# Patient Record
Sex: Male | Born: 1970
Health system: Southern US, Community
[De-identification: ages and names within clinical notes are randomized; demographics above are authoritative.]

## PROBLEM LIST (undated history)

## (undated) DIAGNOSIS — G4730 Sleep apnea, unspecified: Secondary | ICD-10-CM

## (undated) DIAGNOSIS — E7800 Pure hypercholesterolemia, unspecified: Secondary | ICD-10-CM

## (undated) DIAGNOSIS — E119 Type 2 diabetes mellitus without complications: Secondary | ICD-10-CM

## (undated) DIAGNOSIS — I10 Essential (primary) hypertension: Secondary | ICD-10-CM

## (undated) DIAGNOSIS — N200 Calculus of kidney: Secondary | ICD-10-CM

## (undated) DIAGNOSIS — E669 Obesity, unspecified: Secondary | ICD-10-CM

## (undated) HISTORY — PX: WISDOM TOOTH EXTRACTION: SHX21

## (undated) HISTORY — DX: Essential (primary) hypertension: I10

## (undated) HISTORY — DX: Type 2 diabetes mellitus without complications: E11.9

## (undated) HISTORY — DX: Obesity, unspecified: E66.9

## (undated) HISTORY — DX: Pure hypercholesterolemia, unspecified: E78.00

## (undated) HISTORY — DX: Sleep apnea, unspecified: G47.30

---

## 2012-08-26 ENCOUNTER — Ambulatory Visit (INDEPENDENT_AMBULATORY_CARE_PROVIDER_SITE_OTHER): Payer: Managed Care, Other (non HMO) | Admitting: General Surgery

## 2012-08-26 ENCOUNTER — Encounter (INDEPENDENT_AMBULATORY_CARE_PROVIDER_SITE_OTHER): Payer: Self-pay | Admitting: General Surgery

## 2012-08-26 VITALS — BP 150/90 | HR 94 | Temp 99.7°F | Resp 20 | Ht 72.0 in | Wt 250.0 lb

## 2012-08-26 DIAGNOSIS — K644 Residual hemorrhoidal skin tags: Secondary | ICD-10-CM

## 2012-08-26 DIAGNOSIS — K645 Perianal venous thrombosis: Secondary | ICD-10-CM

## 2012-08-26 MED ORDER — AMOXICILLIN-POT CLAVULANATE 875-125 MG PO TABS: 1.0000 | ORAL_TABLET | Freq: Two times a day (BID) | ORAL | Status: AC

## 2012-08-26 MED ORDER — OXYCODONE-ACETAMINOPHEN 5-325 MG PO TABS: 1.0000 | ORAL_TABLET | Freq: Four times a day (QID) | ORAL | Status: DC | PRN

## 2012-08-26 NOTE — Patient Instructions (Signed)
Remove packing strip in the morning Get prescriptions and take antibiotic Call for Temp >101, worsening pain, pain with urination Soak in warm water tub 3-4 times a day and after a bowel movement Try to minimize constipation at all costs.  GETTING TO GOOD BOWEL HEALTH. Irregular bowel habits such as constipation and diarrhea can lead to many problems over time.  Having one soft bowel movement a day is the most important way to prevent further problems.  The anorectal canal is designed to handle stretching and feces to safely manage our ability to get rid of solid waste (feces, poop, stool) out of our body.  BUT, hard constipated stools can act like ripping concrete bricks and diarrhea can be a burning fire to this very sensitive area of our body, causing inflamed hemorrhoids, anal fissures, increasing risk is perirectal abscesses, abdominal pain/bloating, an making irritable bowel worse.     The goal: ONE SOFT BOWEL MOVEMENT A DAY!  To have soft, regular bowel movements:    Drink at least 8 tall glasses of water a day.     Take plenty of fiber.  Fiber is the undigested part of plant food that passes into the colon, acting s "natures broom" to encourage bowel motility and movement.  Fiber can absorb and hold large amounts of water. This results in a larger, bulkier stool, which is soft and easier to pass. Work gradually over several weeks up to 6 servings a day of fiber (25g a day even more if needed) in the form of: o Vegetables -- Root (potatoes, carrots, turnips), leafy green (lettuce, salad greens, celery, spinach), or cooked high residue (cabbage, broccoli, etc) o Fruit -- Fresh (unpeeled skin & pulp), Dried (prunes, apricots, cherries, etc ),  or stewed ( applesauce)  o Whole grain breads, pasta, etc (whole wheat)  o Bran cereals    Bulking Agents -- This type of water-retaining fiber generally is easily obtained each day by one of the following:  o Psyllium bran -- The psyllium plant is  remarkable because its ground seeds can retain so much water. This product is available as Metamucil, Konsyl, Effersyllium, Per Diem Fiber, or the less expensive generic preparation in drug and health food stores. Although labeled a laxative, it really is not a laxative.  o Methylcellulose -- This is another fiber derived from wood which also retains water. It is available as Citrucel. o Polyethylene Glycol - and "artificial" fiber commonly called Miralax or Glycolax.  It is helpful for people with gassy or bloated feelings with regular fiber o Flax Seed - a less gassy fiber than psyllium   No reading or other relaxing activity while on the toilet. If bowel movements take longer than 5 minutes, you are too constipated   AVOID CONSTIPATION.  High fiber and water intake usually takes care of this.  Sometimes a laxative is needed to stimulate more frequent bowel movements, but    Laxatives are not a good long-term solution as it can wear the colon out. o Osmotics (Milk of Magnesia, Fleets phosphosoda, Magnesium citrate, MiraLax, GoLytely) are safer than  o Stimulants (Senokot, Castor Oil, Dulcolax, Ex Lax)    o Do not take laxatives for more than 7days in a row.    IF SEVERELY CONSTIPATED, try a Bowel Retraining Program: o Do not use laxatives.  o Eat a diet high in roughage, such as bran cereals and leafy vegetables.  o Drink six (6) ounces of prune or apricot juice each morning.  o  Eat two (2) large servings of stewed fruit each day.  o Take one (1) heaping tablespoon of a psyllium-based bulking agent twice a day. Use sugar-free sweetener when possible to avoid excessive calories.  o Eat a normal breakfast.  o Set aside 15 minutes after breakfast to sit on the toilet, but do not strain to have a bowel movement.  o If you do not have a bowel movement by the third day, use an enema and repeat the above steps.  o

## 2012-08-26 NOTE — Progress Notes (Signed)
Patient ID: Rodney Dominguez, male   DOB: 04/25/1970, 42 y.o.   MRN: [id number]  Chief Complaint  Patient presents with  . New Evaluation    eval anal abscess    HPI Rodney Dominguez is a 42 y.o. male.   HPI 42 yo WM referred by Dr Manus Gunning for perirectal abscess. Patient states he was in his usual state health until this past Sunday. He states that on Sunday he noticed a small hard lump around his anus. It was tender. The area became more and more painful. He applied Preparation H without any relief. He noticed that the area became longer. On Wednesday it started draining blood and pus. He saw his primary care physician today who is concerned about abscess and was referred here. He is a diabetic but is not taking medication. He states that he is borderline. He denies any fever. He denies any dysuria or trouble urinating. He normally averages about 5-6 bowel movements a week. He sits on the toilet for about 3-4 minutes at a time. He denies any pain with defecation. He drinks one 32 ounce of water a day he denies any perianal itching or burning. He denies any prior symptoms Past Medical History  Diagnosis Date  . Sleep apnea   . Hypertension   . Hyperlipidemia     Past Surgical History  Procedure Laterality Date  . Wisdom tooth extraction      Family History  Problem Relation Age of Onset  . Hypertension Mother   . Hyperlipidemia Mother   . Cancer Mother   . Diabetes Mother   . Hypertension Father   . Hyperlipidemia Father     Social History History  Substance Use Topics  . Smoking status: Never Smoker   . Smokeless tobacco: Not on file  . Alcohol Use: No    No Known Allergies  Current Outpatient Prescriptions  Medication Sig Dispense Refill  . diltiazem (CARDIZEM CD) 240 MG 24 hr capsule Take 240 mg by mouth daily.      . Multiple Vitamin (MULTIVITAMIN) capsule Take 1 capsule by mouth daily.      . simvastatin (ZOCOR) 20 MG tablet Take 20 mg by mouth every evening.      .  valsartan-hydrochlorothiazide (DIOVAN-HCT) 160-25 MG per tablet Take 1 tablet by mouth daily.      Marland Kitchen amoxicillin-clavulanate (AUGMENTIN) 875-125 MG per tablet Take 1 tablet by mouth 2 (two) times daily.  24 tablet  0  . oxyCODONE-acetaminophen (ROXICET) 5-325 MG per tablet Take 1 tablet by mouth every 6 (six) hours as needed for pain.  30 tablet  0   No current facility-administered medications for this visit.    Review of Systems Review of Systems  Constitutional: Negative for fever, chills, appetite change and unexpected weight change.  HENT: Positive for dental problem.   Cardiovascular: Negative for chest pain.  Gastrointestinal: Positive for rectal pain. Negative for nausea, vomiting, abdominal pain and blood in stool.  Endocrine:       Borderline DM - no meds  Genitourinary: Negative for dysuria, scrotal swelling, difficulty urinating and testicular pain.  Neurological: Negative for dizziness and headaches.  All other systems reviewed and are negative.    Blood pressure 150/90, pulse 94, temperature 99.7 F (37.6 C), resp. rate 20, height 6' (1.829 m), weight 250 lb (113.399 kg).  Physical Exam Physical Exam  Constitutional: He is oriented to person, place, and time. He appears well-developed and well-nourished. No distress.  obese  HENT:  Head: Normocephalic  and atraumatic.  Right Ear: External ear normal.  Left Ear: External ear normal.  Eyes: Conjunctivae are normal. No scleral icterus.  Neck: No tracheal deviation present.  Cardiovascular: Normal rate.   Pulmonary/Chest: Effort normal. No stridor. No respiratory distress.  Abdominal: Soft. He exhibits no distension. There is no tenderness. There is no rebound.  Genitourinary: Rectal exam shows anal tone normal.     Right anterior extending to ant midline - has a necrotic abscessed external hemorrhoid - draining pus with necrotic skin over area. Has some mild thrombosed L lateral ext hemorrhoid. DRE- no fluctuance.  Tolerated DRE well  Musculoskeletal: He exhibits no edema.  Neurological: He is alert and oriented to person, place, and time.  Skin: Skin is warm and dry. No rash noted. He is not diaphoretic. No erythema.  Psychiatric: He has a normal mood and affect. His behavior is normal. Judgment and thought content normal.    Data Reviewed Dr Randel Books note   Assessment    Abscessed right anterior external hemorrhoid     Plan    It appears he has a necrotic thrombosed external hemorrhoid. It is actively draining. Dr. Ezzard Standing also examine the patient. He has some devitalized necrotic tissue. We sharply excised the necrotic tissue. The cavity was gently probed with a Q-tip applicator. The depth was only about 1-1/2 cm. The length of the opening was probably about 4 cm. It did not appear to track any further. There was no area of fluctuance on digital rectal exam.  It appears adequately drained. We both agreed that it did not need to be washed off in the operating room.  I put him on 12 days of Augmentin. He was given a prescription for Percocet. I packed the cavity with iodoform packing today.I instructed him to remove the packing in the morning. I also told him to do 3-4 Sitz bathes a day. I instructed him to contact the office for fever greater then 101, worsening pain, difficulty urinating or any questions or concerns  followup with me in 2 weeks  Mary Sella. Andrey Campanile, MD, FACS General, Bariatric, & Minimally Invasive Surgery [facility], [place]        [facility] M 08/26/2012, 4:33 PM

## 2012-09-08 ENCOUNTER — Ambulatory Visit (INDEPENDENT_AMBULATORY_CARE_PROVIDER_SITE_OTHER): Payer: Managed Care, Other (non HMO) | Admitting: General Surgery

## 2012-09-08 ENCOUNTER — Encounter (INDEPENDENT_AMBULATORY_CARE_PROVIDER_SITE_OTHER): Payer: Self-pay | Admitting: General Surgery

## 2012-09-08 VITALS — BP 110/84 | HR 69 | Temp 98.6°F | Resp 18 | Ht 72.0 in | Wt 249.6 lb

## 2012-09-08 DIAGNOSIS — Z09 Encounter for follow-up examination after completed treatment for conditions other than malignant neoplasm: Secondary | ICD-10-CM

## 2012-09-08 NOTE — Progress Notes (Signed)
Subjective:     Patient ID: Rodney Dominguez, male   DOB: 09-28-1970, 42 y.o.   MRN: [id number]  HPI 42 year old Caucasian male comes in for followup after his incision, drainage and debridement of the necrotic external hemorrhoid in the office. He states that he is doing well. He denies having any fever, chills, dysuria. He reports having a daily bowel movement. He will occasionally have some blood on the toilet tissue after wiping. He reports minimal drainage  Review of Systems     Objective:   Physical Exam BP 110/84  Pulse 69  Temp(Src) 98.6 F (37 C) (Temporal)  Resp 18  Ht 6' (1.829 m)  Wt 249 lb 9.6 oz (113.218 kg)  BMI 33.84 kg/m2 Alert, no apparent distress Rectal-visual inspection reveals an open wound in essentially the anterior midline position. There is no overlying skin in this area. The open wound measures 2 cm by about 1 cm. Depth is about 3 or 4 mm. Digital rectal and anoscopy was deferred today. There is granulation tissue is a filling. There is no fluctuance or cellulitis    Assessment:     S/p I&D &debridement of necrotic anterior ext hemorrhoid     Plan:     Overall I think it is healing well. I explained that'll take another 4-6 weeks for the area to heal by secondary intention. He was encouraged to drink plenty of liquids and a high fiber diet. Followup 8 weeks  Mary Sella. Andrey Campanile, MD, FACS General, Bariatric, & Minimally Invasive Surgery [facility], [place]

## 2012-11-05 ENCOUNTER — Ambulatory Visit (INDEPENDENT_AMBULATORY_CARE_PROVIDER_SITE_OTHER): Payer: Managed Care, Other (non HMO) | Admitting: General Surgery

## 2012-11-05 ENCOUNTER — Encounter (INDEPENDENT_AMBULATORY_CARE_PROVIDER_SITE_OTHER): Payer: Self-pay | Admitting: General Surgery

## 2012-11-05 VITALS — BP 152/70 | HR 72 | Temp 99.6°F | Resp 16 | Ht 72.0 in | Wt 258.4 lb

## 2012-11-05 DIAGNOSIS — K644 Residual hemorrhoidal skin tags: Secondary | ICD-10-CM

## 2012-11-05 NOTE — Progress Notes (Signed)
Subjective:     Patient ID: Rodney Dominguez, male   DOB: 10-May-1970, 42 y.o.   MRN: [id number]  HPI 42 year old Caucasian male comes in for long-term followup after incision, drainage, and debridement of necrotic external hemorrhoid in the office back in early May. I last saw him in the office in may 28. He states that he is doing great. He denies any constipation or diarrhea. He reports a daily bowel movement. He denies any rectal drainage. He denies any melena or hematochezia. He denies any pain with defecation. He denies any perianal itching or burning. He reports drinking 6 glasses of water a day. He is sitting on the commode for less than 2-3 minutes at a time  PMHx, PSHx, SOCHx, FAMHx, ALL reviewed   Review of Systems As above    Objective:   Physical Exam BP 152/70  Pulse 72  Temp(Src) 99.6 F (37.6 C) (Oral)  Resp 16  Ht 6' (1.829 m)  Wt 258 lb 6.4 oz (117.209 kg)  BMI 35.04 kg/m2 Alert, no apparent distress Skin-no jaundice or rash Rectal-well-healed right anterior to anterior midline perianal skin. Small residual scar. No anal fissure. No perianal skin excoriation. Digital rectal exam reveals good tone no palpable masses    Assessment:     History of necrotic external hemorrhoid with abscess     Plan:     It appears the area is completely healed. No signs of fistula in ano. We discussed the importance high fiber diet and good bathroom practices. F/u prn  Mary Sella. Andrey Campanile, MD, FACS General, Bariatric, & Minimally Invasive Surgery [facility], [place]

## 2012-11-05 NOTE — Patient Instructions (Signed)
It has healed  GETTING TO GOOD BOWEL HEALTH. Irregular bowel habits such as constipation and diarrhea can lead to many problems over time.  Having one soft bowel movement a day is the most important way to prevent further problems.  The anorectal canal is designed to handle stretching and feces to safely manage our ability to get rid of solid waste (feces, poop, stool) out of our body.  BUT, hard constipated stools can act like ripping concrete bricks and diarrhea can be a burning fire to this very sensitive area of our body, causing inflamed hemorrhoids, anal fissures, increasing risk is perirectal abscesses, abdominal pain/bloating, an making irritable bowel worse.     The goal: ONE SOFT BOWEL MOVEMENT A DAY!  To have soft, regular bowel movements:    Drink at least 8 tall glasses of water a day.     Take plenty of fiber.  Fiber is the undigested part of plant food that passes into the colon, acting s "natures broom" to encourage bowel motility and movement.  Fiber can absorb and hold large amounts of water. This results in a larger, bulkier stool, which is soft and easier to pass. Work gradually over several weeks up to 6 servings a day of fiber (25g a day even more if needed) in the form of: o Vegetables -- Root (potatoes, carrots, turnips), leafy green (lettuce, salad greens, celery, spinach), or cooked high residue (cabbage, broccoli, etc) o Fruit -- Fresh (unpeeled skin & pulp), Dried (prunes, apricots, cherries, etc ),  or stewed ( applesauce)  o Whole grain breads, pasta, etc (whole wheat)  o Bran cereals    Bulking Agents -- This type of water-retaining fiber generally is easily obtained each day by one of the following:  o Psyllium bran -- The psyllium plant is remarkable because its ground seeds can retain so much water. This product is available as Metamucil, Konsyl, Effersyllium, Per Diem Fiber, or the less expensive generic preparation in drug and health food stores. Although labeled a  laxative, it really is not a laxative.  o Methylcellulose -- This is another fiber derived from wood which also retains water. It is available as Citrucel. o Polyethylene Glycol - and "artificial" fiber commonly called Miralax or Glycolax.  It is helpful for people with gassy or bloated feelings with regular fiber o Flax Seed - a less gassy fiber than psyllium   No reading or other relaxing activity while on the toilet. If bowel movements take longer than 5 minutes, you are too constipated   AVOID CONSTIPATION.  High fiber and water intake usually takes care of this.  Sometimes a laxative is needed to stimulate more frequent bowel movements, but    Laxatives are not a good long-term solution as it can wear the colon out. o Osmotics (Milk of Magnesia, Fleets phosphosoda, Magnesium citrate, MiraLax, GoLytely) are safer than  o Stimulants (Senokot, Castor Oil, Dulcolax, Ex Lax)    o Do not take laxatives for more than 7days in a row.    IF SEVERELY CONSTIPATED, try a Bowel Retraining Program: o Do not use laxatives.  o Eat a diet high in roughage, such as bran cereals and leafy vegetables.  o Drink six (6) ounces of prune or apricot juice each morning.  o Eat two (2) large servings of stewed fruit each day.  o Take one (1) heaping tablespoon of a psyllium-based bulking agent twice a day. Use sugar-free sweetener when possible to avoid excessive calories.  o Eat a  normal breakfast.  o Set aside 15 minutes after breakfast to sit on the toilet, but do not strain to have a bowel movement.  o If you do not have a bowel movement by the third day, use an enema and repeat the above steps.  o

## 2013-06-20 ENCOUNTER — Encounter: Payer: Self-pay | Admitting: General Surgery

## 2013-06-20 DIAGNOSIS — E669 Obesity, unspecified: Secondary | ICD-10-CM | POA: Insufficient documentation

## 2013-06-20 DIAGNOSIS — I10 Essential (primary) hypertension: Secondary | ICD-10-CM

## 2013-06-20 DIAGNOSIS — G4733 Obstructive sleep apnea (adult) (pediatric): Secondary | ICD-10-CM

## 2013-06-28 ENCOUNTER — Ambulatory Visit (INDEPENDENT_AMBULATORY_CARE_PROVIDER_SITE_OTHER): Payer: BC Managed Care – PPO | Admitting: Cardiology

## 2013-06-28 ENCOUNTER — Encounter: Payer: Self-pay | Admitting: Cardiology

## 2013-06-28 VITALS — BP 144/90 | HR 98 | Ht 72.0 in | Wt 247.0 lb

## 2013-06-28 DIAGNOSIS — E669 Obesity, unspecified: Secondary | ICD-10-CM

## 2013-06-28 DIAGNOSIS — I10 Essential (primary) hypertension: Secondary | ICD-10-CM

## 2013-06-28 DIAGNOSIS — G4733 Obstructive sleep apnea (adult) (pediatric): Secondary | ICD-10-CM

## 2013-06-28 NOTE — Progress Notes (Signed)
[address] [place], [place]  [address] Phone: [phone] Fax:  [phone]  Date:  06/28/2013   ID:  Rodney Dominguez, DOB 1971/03/14, MRN [id number]  PCP:  Thora Lance, MD  Sleep Medicine:  Armanda Magic, MD   History of Present Illness: Rodney Dominguez is a 43 y.o. male with a history of OSA, HTN and obesity who presents today for followup.  He is doing well.  He tolerates his CPAP therapy with no problems.  He feels rested in the am and has no daytime sleepiness.  He tolerates the mask and feels the pressure is adequate.   Wt Readings from Last 3 Encounters:  06/28/13 247 lb (112.038 kg)  11/05/12 258 lb 6.4 oz (117.209 kg)  09/08/12 249 lb 9.6 oz (113.218 kg)     Past Medical History  Diagnosis Date  . Sleep apnea   . Hypertension   . Hypercholesteremia   . Diabetes mellitus without complication     type II  . Obesity     Current Outpatient Prescriptions  Medication Sig Dispense Refill  . atorvastatin (LIPITOR) 20 MG tablet Once daily      . BG STAR TEST test strip       . diltiazem (CARDIZEM CD) 240 MG 24 hr capsule Take 240 mg by mouth daily.      Marland Kitchen EPIPEN 2-PAK 0.3 MG/0.3ML SOAJ       . Multiple Vitamins-Minerals (MULTIVITAMIN WITH MINERALS) tablet Take 1 tablet by mouth daily.      . valsartan-hydrochlorothiazide (DIOVAN-HCT) 320-25 MG per tablet Take 1 tablet by mouth daily.       No current facility-administered medications for this visit.    Allergies:    Allergies  Allergen Reactions  . Bee Venom Anaphylaxis  . Ace Inhibitors Cough    Social History:  The patient  reports that he has quit smoking. He does not have any smokeless tobacco history on file. He reports that he does not drink alcohol or use illicit drugs.   Family History:  The patient's family history includes Cancer in his mother; Diabetes in his mother; Hyperlipidemia in his father and mother; Hypertension in his father and mother.   ROS:  Please see the history of  present illness.      All other systems reviewed and negative.   PHYSICAL EXAM: VS:  BP 144/90  Pulse 98  Ht 6' (1.829 m)  Wt 247 lb (112.038 kg)  BMI 33.49 kg/m2 Well nourished, well developed, in no acute distress HEENT: normal Neck: no JVD Cardiac:  normal S1, S2; RRR; no murmur Lungs:  clear to auscultation bilaterally, no wheezing, rhonchi or rales Abd: soft, nontender, no hepatomegaly Ext: no edema Skin: warm and dry Neuro:  CNs 2-12 intact, no focal abnormalities noted       ASSESSMENT AND PLAN:  1. OSA on CPAP.  His download today showed an AHI of 0.6/hr on 11cm H2O and 59% compliance in using more than 4 hours nightly.  I stressed the importance of being complaint with his CPAP.  He went on a trip for a few days recently and then when he got back he did not hook it back up for 3 months.  He also does not always get more than 5 hours of sleep.   2. Obesity- I have encouraged him to get into a routine exercise program 3. HTN - borderline control  - I asked him to check his BP daily for a week and  call with results.  - continue Diltiazem and Diovan HCT  - he will check his BP daily for a week and call with the results  Followup with me in 6 months  Signed, Armanda Magicraci Turner, MD 06/28/2013 9:12 AM

## 2013-06-28 NOTE — Patient Instructions (Signed)
Your physician recommends that you continue on your current medications as directed. Please refer to the Current Medication list given to you today.  Your physician wants you to follow-up in: 6 months with Dr Turner You will receive a reminder letter in the mail two months in advance. If you don't receive a letter, please call our office to schedule the follow-up appointment.  

## 2013-07-08 ENCOUNTER — Telehealth: Payer: Self-pay | Admitting: Cardiology

## 2013-07-08 NOTE — Telephone Encounter (Signed)
I spoke with pt & states these blood pressures were taken after his medications ( diltiazem & diovan) States he did miss one day of taking meds this week.  States that he is feeling great Will forward this to Dr. Mayford Knifeurner for review.  Mylo Redebbie Windy Dudek RN

## 2013-07-08 NOTE — Telephone Encounter (Signed)
New message    patient calling back with blood pressure reading    3/17 144/90  3/18 138/85 3/19 140/87 3/20 142/89 3/21 142/93 3/23 135/91 3/27 132/89 .  Skip 3/22, 3/24-3/26

## 2013-07-11 NOTE — Telephone Encounter (Signed)
Please increase Cardizem CD to 300 mg daily and check BP daily for a week and call with results

## 2013-07-11 NOTE — Telephone Encounter (Signed)
I called pt & he would like to have his pcp Dr. Manus GunningEhinger review readings as well b/c " he was the one who is treating my high blood pressure & cholesterol"  I have forwarded note to Dr. Kyla BalzarineEhinger Debbie Atley Scarboro RN

## 2013-07-11 NOTE — Telephone Encounter (Signed)
Are you ok calling this pt?

## 2013-07-11 NOTE — Telephone Encounter (Signed)
That is fine 

## 2013-07-13 ENCOUNTER — Encounter: Payer: Self-pay | Admitting: Cardiology

## 2013-10-04 ENCOUNTER — Emergency Department (HOSPITAL_COMMUNITY): Payer: BC Managed Care – PPO

## 2013-10-04 ENCOUNTER — Emergency Department (HOSPITAL_COMMUNITY)
Admission: EM | Admit: 2013-10-04 | Discharge: 2013-10-04 | Disposition: A | Discharge status: Home / Self Care | Payer: BC Managed Care – PPO | Attending: Emergency Medicine | Admitting: Emergency Medicine

## 2013-10-04 ENCOUNTER — Encounter (HOSPITAL_COMMUNITY): Payer: Self-pay | Admitting: Emergency Medicine

## 2013-10-04 DIAGNOSIS — R112 Nausea with vomiting, unspecified: Secondary | ICD-10-CM | POA: Insufficient documentation

## 2013-10-04 DIAGNOSIS — I10 Essential (primary) hypertension: Secondary | ICD-10-CM | POA: Insufficient documentation

## 2013-10-04 DIAGNOSIS — E7800 Pure hypercholesterolemia, unspecified: Secondary | ICD-10-CM | POA: Insufficient documentation

## 2013-10-04 DIAGNOSIS — Z79899 Other long term (current) drug therapy: Secondary | ICD-10-CM | POA: Insufficient documentation

## 2013-10-04 DIAGNOSIS — R630 Anorexia: Secondary | ICD-10-CM | POA: Insufficient documentation

## 2013-10-04 DIAGNOSIS — G4730 Sleep apnea, unspecified: Secondary | ICD-10-CM | POA: Insufficient documentation

## 2013-10-04 DIAGNOSIS — Z888 Allergy status to other drugs, medicaments and biological substances status: Secondary | ICD-10-CM | POA: Insufficient documentation

## 2013-10-04 DIAGNOSIS — Z7982 Long term (current) use of aspirin: Secondary | ICD-10-CM | POA: Insufficient documentation

## 2013-10-04 DIAGNOSIS — E119 Type 2 diabetes mellitus without complications: Secondary | ICD-10-CM | POA: Insufficient documentation

## 2013-10-04 DIAGNOSIS — E669 Obesity, unspecified: Secondary | ICD-10-CM | POA: Insufficient documentation

## 2013-10-04 DIAGNOSIS — N200 Calculus of kidney: Secondary | ICD-10-CM | POA: Insufficient documentation

## 2013-10-04 DIAGNOSIS — Z91038 Other insect allergy status: Secondary | ICD-10-CM | POA: Insufficient documentation

## 2013-10-04 LAB — BASIC METABOLIC PANEL
BUN: 14 mg/dL (ref 6–23)
CHLORIDE: 101 meq/L (ref 96–112)
CO2: 25 meq/L (ref 19–32)
Calcium: 8.7 mg/dL (ref 8.4–10.5)
Creatinine, Ser: 1.1 mg/dL (ref 0.50–1.35)
GFR calc Af Amer: >90 mL/min (ref >90)
GFR calc non Af Amer: 81 mL/min — ABNORMAL LOW (ref >90)
GLUCOSE: 109 mg/dL — AB (ref 70–99)
POTASSIUM: 2.9 meq/L — AB (ref 3.7–5.3)
Sodium: 141 mEq/L (ref 137–147)

## 2013-10-04 LAB — CBC WITH DIFFERENTIAL/PLATELET
Basophils Absolute: 0 10*3/uL (ref 0.0–0.1)
Basophils Relative: 0 % (ref 0–1)
Eosinophils Absolute: 0 10*3/uL (ref 0.0–0.7)
Eosinophils Relative: 0 % (ref 0–5)
HCT: 35.1 % — ABNORMAL LOW (ref 39.0–52.0)
HEMOGLOBIN: 12.3 g/dL — AB (ref 13.0–17.0)
LYMPHS ABS: 1.3 10*3/uL (ref 0.7–4.0)
Lymphocytes Relative: 18 % (ref 12–46)
MCH: 30.1 pg (ref 26.0–34.0)
MCHC: 35 g/dL (ref 30.0–36.0)
MCV: 85.8 fL (ref 78.0–100.0)
MONOS PCT: 11 % (ref 3–12)
Monocytes Absolute: 0.8 10*3/uL (ref 0.1–1.0)
NEUTROS ABS: 5.1 10*3/uL (ref 1.7–7.7)
Neutrophils Relative %: 71 % (ref 43–77)
Platelets: 247 10*3/uL (ref 150–400)
RBC: 4.09 MIL/uL — AB (ref 4.22–5.81)
RDW: 12.8 % (ref 11.5–15.5)
WBC: 7.3 10*3/uL (ref 4.0–10.5)

## 2013-10-04 LAB — URINALYSIS, ROUTINE W REFLEX MICROSCOPIC
Bilirubin Urine: NEGATIVE
Glucose, UA: NEGATIVE mg/dL
Ketones, ur: NEGATIVE mg/dL
Nitrite: NEGATIVE
PROTEIN: NEGATIVE mg/dL
Specific Gravity, Urine: 1.013 (ref 1.005–1.030)
Urobilinogen, UA: 1 mg/dL (ref 0.0–1.0)
pH: 6.5 (ref 5.0–8.0)

## 2013-10-04 LAB — URINE MICROSCOPIC-ADD ON

## 2013-10-04 MED ORDER — KETOROLAC TROMETHAMINE 30 MG/ML IJ SOLN
30.0000 mg | Freq: Once | INTRAMUSCULAR | Status: AC
  Administered 2013-10-04: 30 mg via INTRAVENOUS
  Filled 2013-10-04: qty 1

## 2013-10-04 MED ORDER — PROMETHAZINE HCL 25 MG PO TABS: 25.0000 mg | ORAL_TABLET | Freq: Four times a day (QID) | ORAL | Status: DC | PRN

## 2013-10-04 MED ORDER — SODIUM CHLORIDE 0.9 % IV BOLUS (SEPSIS)
1000.0000 mL | Freq: Once | INTRAVENOUS | Status: AC
  Administered 2013-10-04: 1000 mL via INTRAVENOUS

## 2013-10-04 MED ORDER — ONDANSETRON 4 MG PO TBDP
8.0000 mg | ORAL_TABLET | Freq: Once | ORAL | Status: AC
  Administered 2013-10-04: 8 mg via ORAL
  Filled 2013-10-04: qty 2

## 2013-10-04 MED ORDER — HYDROMORPHONE HCL PF 1 MG/ML IJ SOLN
1.0000 mg | Freq: Once | INTRAMUSCULAR | Status: AC
Start: 2013-10-04 — End: 2013-10-04
  Administered 2013-10-04: 1 mg via INTRAVENOUS
  Filled 2013-10-04: qty 1

## 2013-10-04 MED ORDER — OXYCODONE-ACETAMINOPHEN 5-325 MG PO TABS: 2.0000 | ORAL_TABLET | ORAL | Status: DC | PRN

## 2013-10-04 MED ORDER — POTASSIUM CHLORIDE CRYS ER 20 MEQ PO TBCR
80.0000 meq | EXTENDED_RELEASE_TABLET | Freq: Once | ORAL | Status: AC
  Administered 2013-10-04: 80 meq via ORAL
  Filled 2013-10-04: qty 4

## 2013-10-04 MED ORDER — ONDANSETRON 4 MG PO TBDP: ORAL_TABLET | ORAL | Status: DC

## 2013-10-04 MED ORDER — HYDROMORPHONE HCL PF 1 MG/ML IJ SOLN
1.0000 mg | Freq: Once | INTRAMUSCULAR | Status: AC
  Administered 2013-10-04: 1 mg via INTRAVENOUS
  Filled 2013-10-04: qty 1

## 2013-10-04 MED ORDER — MORPHINE SULFATE 4 MG/ML IJ SOLN
4.0000 mg | Freq: Once | INTRAMUSCULAR | Status: AC
  Administered 2013-10-04: 4 mg via INTRAVENOUS
  Filled 2013-10-04: qty 1

## 2013-10-04 NOTE — Discharge Instructions (Signed)
Your CT shows that you have a large kidney stone. The urologist on call today was Dr. Mena Goes, and he wants you to call the office as soon as you leave the emergency department, so he can make an appointment with you for later today. Use the Zofran OR Phenergan (which ever is cheaper for you) for nausea. Use Percocet as directed for pain. If your pain increases and is unrelieved by this medication, call the urology office again. Stay well hydrated. Do not operate heavy machinery or drive while taking pain medicine.  Kidney Stones Kidney stones (urolithiasis) are deposits that form inside your kidneys. The intense pain is caused by the stone moving through the urinary tract. When the stone moves, the ureter goes into spasm around the stone. The stone is usually passed in the urine.  CAUSES   A disorder that makes certain neck glands produce too much parathyroid hormone (primary hyperparathyroidism).  A buildup of uric acid crystals, similar to gout in your joints.  Narrowing (stricture) of the ureter.  A kidney obstruction present at birth (congenital obstruction).  Previous surgery on the kidney or ureters.  Numerous kidney infections. SYMPTOMS   Feeling sick to your stomach (nauseous).  Throwing up (vomiting).  Blood in the urine (hematuria).  Pain that usually spreads (radiates) to the groin.  Frequency or urgency of urination. DIAGNOSIS   Taking a history and physical exam.  Blood or urine tests.  CT scan.  Occasionally, an examination of the inside of the urinary bladder (cystoscopy) is performed. TREATMENT   Observation.  Increasing your fluid intake.  Extracorporeal shock wave lithotripsy--This is a noninvasive procedure that uses shock waves to break up kidney stones.  Surgery may be needed if you have severe pain or persistent obstruction. There are various surgical procedures. Most of the procedures are performed with the use of small instruments. Only small  incisions are needed to accommodate these instruments, so recovery time is minimized. The size, location, and chemical composition are all important variables that will determine the proper choice of action for you. Talk to your health care provider to better understand your situation so that you will minimize the risk of injury to yourself and your kidney.  HOME CARE INSTRUCTIONS   Drink enough water and fluids to keep your urine clear or pale yellow. This will help you to pass the stone or stone fragments.  Strain all urine through the provided strainer. Keep all particulate matter and stones for your health care provider to see. The stone causing the pain may be as small as a grain of salt. It is very important to use the strainer each and every time you pass your urine. The collection of your stone will allow your health care provider to analyze it and verify that a stone has actually passed. The stone analysis will often identify what you can do to reduce the incidence of recurrences.  Only take over-the-counter or prescription medicines for pain, discomfort, or fever as directed by your health care provider.  Make a follow-up appointment with your health care provider as directed.  Get follow-up X-rays if required. The absence of pain does not always mean that the stone has passed. It may have only stopped moving. If the urine remains completely obstructed, it can cause loss of kidney function or even complete destruction of the kidney. It is your responsibility to make sure X-rays and follow-ups are completed. Ultrasounds of the kidney can show blockages and the status of the kidney. Ultrasounds  are not associated with any radiation and can be performed easily in a matter of minutes. SEEK MEDICAL CARE IF:  You experience pain that is progressive and unresponsive to any pain medicine you have been prescribed. SEEK IMMEDIATE MEDICAL CARE IF:   Pain cannot be controlled with the prescribed  medicine.  You have a fever or shaking chills.  The severity or intensity of pain increases over 18 hours and is not relieved by pain medicine.  You develop a new onset of abdominal pain.  You feel faint or pass out.  You are unable to urinate. MAKE SURE YOU:   Understand these instructions.  Will watch your condition.  Will get help right away if you are not doing well or get worse. Document Released: 03/31/2005 Document Revised: 12/01/2012 Document Reviewed: 09/01/2012 [facility] Patient Information 2015 [place], [place]. This information is not intended to replace advice given to you by your health care provider. Make sure you discuss any questions you have with your health care provider.  Dietary Guidelines to Help Prevent Kidney Stones Your risk of kidney stones can be decreased by adjusting the foods you eat. The most important thing you can do is drink enough fluid. You should drink enough fluid to keep your urine clear or pale yellow. The following guidelines provide specific information for the type of kidney stone you have had. GUIDELINES ACCORDING TO TYPE OF KIDNEY STONE Calcium Oxalate Kidney Stones  Reduce the amount of salt you eat. Foods that have a lot of salt cause your body to release excess calcium into your urine. The excess calcium can combine with a substance called oxalate to form kidney stones.  Reduce the amount of animal protein you eat if the amount you eat is excessive. Animal protein causes your body to release excess calcium into your urine. Ask your dietitian how much protein from animal sources you should be eating.  Avoid foods that are high in oxalates. If you take vitamins, they should have less than 500 mg of vitamin C. Your body turns vitamin C into oxalates. You do not need to avoid fruits and vegetables high in vitamin C. Calcium Phosphate Kidney Stones  Reduce the amount of salt you eat to help prevent the release of excess calcium into your  urine.  Reduce the amount of animal protein you eat if the amount you eat is excessive. Animal protein causes your body to release excess calcium into your urine. Ask your dietitian how much protein from animal sources you should be eating.  Get enough calcium from food or take a calcium supplement (ask your dietitian for recommendations). Food sources of calcium that do not increase your risk of kidney stones include:  Broccoli.  Dairy products, such as cheese and yogurt.  Pudding. Uric Acid Kidney Stones  Do not have more than 6 oz of animal protein per day. FOOD SOURCES Animal Protein Sources  Meat (all types).  Poultry.  Eggs.  Fish, seafood. Foods High in MirantSalt  Salt seasonings.  Soy sauce.  Teriyaki sauce.  Cured and processed meats.  Salted crackers and snack foods.  Fast food.  Canned soups and most canned foods. Foods High in Oxalates  Grains:  Amaranth.  Barley.  Grits.  Wheat germ.  Bran.  Buckwheat flour.  All bran cereals.  Pretzels.  Whole wheat bread.  Vegetables:  Beans (wax).  Beets and beet greens.  Collard greens.  Eggplant.  Escarole.  Leeks.  Okra.  Parsley.  Rutabagas.  Spinach.  Swiss  chard.  Tomato paste.  Fried potatoes.  Sweet potatoes.  Fruits:  Red currants.  Figs.  Kiwi.  Rhubarb.  Meat and Other Protein Sources:  Beans (dried).  Soy burgers and other soybean products.  Miso.  Nuts (peanuts, almonds, pecans, cashews, hazelnuts).  Nut butters.  Sesame seeds and tahini (paste made of sesame seeds).  Poppy seeds.  Beverages:  Chocolate drink mixes.  Soy milk.  Instant iced tea.  Juices made from high-oxalate fruits or vegetables.  Other:  Carob.  Chocolate.  Fruitcake.  Marmalades. Document Released: 07/26/2010 Document Revised: 04/05/2013 Document Reviewed: 02/25/2013 [facility]ExitCare Patient Information 2015 [place], [place]. This information is not intended to  replace advice given to you by your health care provider. Make sure you discuss any questions you have with your health care provider.

## 2013-10-04 NOTE — ED Provider Notes (Signed)
CSN: [id number]     Arrival date & time 10/04/13  0115 History   First MD Initiated Contact with Patient 10/04/13 0600     Chief Complaint  Patient presents with  . Flank Pain     (Consider location/radiation/quality/duration/timing/severity/associated sxs/prior Treatment) HPI Comments: Rodney Dominguez is a 43 y.o. Male with a PMHx of HTN and HLD who presents to the ED today with complaints of intermittent 8/10 sharp L sided flank pain that radiates into his L abdomen and occasionally into his groin. Reports that the pain began approximately 2 weeks ago, but was not as intense and was very infrequent. Subsequently, beginning Thursday 09/29/13 the pain intensified and became more frequent, although still intermittent in nature. He also developed N/V at this time. Pain is aggravated by laying on affected side and movement, alleviated by laying on unaffected side. States at first his urine looked darker, but has cleared up slightly; no frank hematuria noted by pt. Endorses decreased appetite over the last few days, but he has been able to keep fluids down. Denies CP, SOB, HA, neck pain, sore throat, rhinorrhea, diarrhea/constipation, testicular swelling, penile discharge, myalgias or arthralgias. Non-smoker, denies alcohol, no illicit drugs. No known allergies to medications or contrast dyes.   Patient is a 43 y.o. male presenting with flank pain. The history is provided by the patient. No language interpreter was used.  Flank Pain This is a new problem. The current episode started in the past 7 days. The problem occurs intermittently. The problem has been unchanged. Associated symptoms include abdominal pain, anorexia, nausea, urinary symptoms and vomiting. Pertinent negatives include no arthralgias, change in bowel habit, chest pain, chills, congestion, coughing, fever, headaches, myalgias, neck pain, rash, sore throat or weakness. Exacerbated by: movement, laying on unaffected side. He has tried rest for  the symptoms. The treatment provided no relief.    Past Medical History  Diagnosis Date  . Sleep apnea   . Hypertension   . Hypercholesteremia   . Diabetes mellitus without complication     type II  . Obesity    Past Surgical History  Procedure Laterality Date  . Wisdom tooth extraction     Family History  Problem Relation Age of Onset  . Hypertension Mother   . Hyperlipidemia Mother   . Cancer Mother   . Diabetes Mother   . Hypertension Father   . Hyperlipidemia Father    History  Substance Use Topics  . Smoking status: Former Games developermoker  . Smokeless tobacco: Not on file     Comment: ocassionally smokes a cigar  . Alcohol Use: No    Review of Systems  Constitutional: Positive for appetite change. Negative for fever and chills.  HENT: Negative for congestion, rhinorrhea and sore throat.   Eyes: Negative for pain and visual disturbance.  Respiratory: Negative for cough and shortness of breath.   Cardiovascular: Negative for chest pain.  Gastrointestinal: Positive for nausea, vomiting, abdominal pain and anorexia. Negative for diarrhea, constipation, blood in stool, abdominal distention and change in bowel habit.  Genitourinary: Positive for flank pain, decreased urine volume and difficulty urinating. Negative for dysuria, urgency, frequency, discharge, penile swelling, scrotal swelling, penile pain and testicular pain.  Musculoskeletal: Positive for back pain. Negative for arthralgias, myalgias, neck pain and neck stiffness.  Skin: Negative for rash.  Neurological: Negative for dizziness, syncope, weakness and headaches.  Psychiatric/Behavioral: Negative for confusion.  10 Systems reviewed and are negative for acute change except as noted in the HPI.  Allergies  Bee venom and Ace inhibitors  Home Medications   Prior to Admission medications   Medication Sig Start Date End Date Taking? Authorizing Provider  aspirin EC 81 MG tablet Take 81 mg by mouth daily.   Yes  Historical Provider, MD  atorvastatin (LIPITOR) 20 MG tablet Take 20 mg by mouth daily. Once daily 05/25/13  Yes Historical Provider, MD  diltiazem (CARDIZEM CD) 240 MG 24 hr capsule Take 240 mg by mouth daily.   Yes Historical Provider, MD  EPIPEN 2-PAK 0.3 MG/0.3ML SOAJ 0.3 mg once.  08/02/12  Yes Historical Provider, MD  Multiple Vitamins-Minerals (MULTIVITAMIN WITH MINERALS) tablet Take 1 tablet by mouth daily.   Yes Historical Provider, MD  valsartan-hydrochlorothiazide (DIOVAN-HCT) 320-25 MG per tablet Take 1 tablet by mouth daily.   Yes Historical Provider, MD  BG STAR TEST test strip  05/28/13   Historical Provider, MD  ondansetron (ZOFRAN ODT) 4 MG disintegrating tablet 4mg  ODT q6 hours prn nausea/vomit 10/04/13   Mercedes Strupp Camprubi-Soms, PA-C  oxyCODONE-acetaminophen (PERCOCET) 5-325 MG per tablet Take 2 tablets by mouth every 4 (four) hours as needed for moderate pain. 10/04/13   Mercedes Strupp Camprubi-Soms, PA-C  promethazine (PHENERGAN) 25 MG tablet Take 1 tablet (25 mg total) by mouth every 6 (six) hours as needed for nausea or vomiting. 10/04/13   [place] Camprubi-Soms, PA-C   BP 123/84  Pulse 89  Temp(Src) 98.2 F (36.8 C) (Oral)  Resp 11  Wt 236 lb (107.049 kg)  SpO2 95% Physical Exam  Nursing note and vitals reviewed. Constitutional: He is oriented to person, place, and time. Vital signs are normal. He appears well-developed and well-nourished. No distress.  HENT:  Head: Normocephalic and atraumatic.  Nose: Nose normal.  Mouth/Throat: Oropharynx is clear and moist. Mucous membranes are dry.  Mucous membranes slightly dry  Eyes: Conjunctivae and EOM are normal. Right eye exhibits no discharge. Left eye exhibits no discharge.  Neck: Normal range of motion. Neck supple.  Cardiovascular: Normal rate, regular rhythm, normal heart sounds and intact distal pulses.   No murmur heard. Pulmonary/Chest: Effort normal and breath sounds normal. He has no wheezes. He has  no rales.  Abdominal: Soft. Normal appearance and bowel sounds are normal. He exhibits no distension. There is tenderness in the left upper quadrant and left lower quadrant. There is CVA tenderness. There is no rigidity, no rebound and no guarding.  Left sided abdominal TTP with no R/G/R. +BS throughout. +CVA tenderness on L side  Musculoskeletal: Normal range of motion.  Neurological: He is alert and oriented to person, place, and time.  Skin: Skin is warm, dry and intact. No rash noted.  Psychiatric: He has a normal mood and affect.    ED Course  Procedures (including critical care time) Labs Review Labs Reviewed  URINALYSIS, ROUTINE W REFLEX MICROSCOPIC - Abnormal; Notable for the following:    Hgb urine dipstick TRACE (*)    Leukocytes, UA TRACE (*)    All other components within normal limits  CBC WITH DIFFERENTIAL - Abnormal; Notable for the following:    RBC 4.09 (*)    Hemoglobin 12.3 (*)    HCT 35.1 (*)    All other components within normal limits  BASIC METABOLIC PANEL - Abnormal; Notable for the following:    Potassium 2.9 (*)    Glucose, Bld 109 (*)    GFR calc non Af Amer 81 (*)    All other components within normal limits  URINE MICROSCOPIC-ADD ON  Imaging Review Ct Abdomen Pelvis Wo Contrast  10/04/2013   CLINICAL DATA:  Left flank pain for several weeks. Occasional nausea and vomiting. No history of stones.  EXAM: CT ABDOMEN AND PELVIS WITHOUT CONTRAST  TECHNIQUE: Multidetector CT imaging of the abdomen and pelvis was performed following the standard protocol without IV contrast.  COMPARISON:  None.  FINDINGS: 2.4 cm left renal pelvis stone lying just above the ureteropelvic junction. There is moderate left hydronephrosis and left perinephric and peripelvic fat stranding. Left ureter is normal course and caliber with no stones. There is a 4-5 mm nonobstructing stone in the lower pole of the right kidney. No other intrarenal stones. No renal masses. No right  hydronephrosis. Normal right ureter.  Bladder wall appears mildly thickened, but this may be a function of the minimal distention. No bladder stone or mass.  Heart is top-normal in size.  Lung bases are essentially clear.  Diffuse fatty infiltration of the liver. No liver mass or focal lesion. Normal liver size.  Multiple gallstones. Gallbladder otherwise unremarkable with no inflammatory change.  Normal spleen, pancreas and adrenal glands.  No pathologically enlarged lymph nodes.  No ascites.  Bowel is unremarkable.  Normal appendix.  Degenerative changes noted of the spine. There is significant disc bulging, possibly a chronic disc protrusion, at L5-S1.  IMPRESSION: 1. 2.4 cm stone in the left renal pelvis just above the ureteral vesicular junction, which is causing obstruction leading to moderate left hydronephrosis and perinephric and peripelvic stranding. 2. No other acute finding. 3. Small nonobstructing stone in the lower pole of the right kidney. 4. Gallstones. Hepatic steatosis. Degenerative changes noted of the visualized spine particularly L5-S1 where there may be a chronic broad-based disc protrusion.   Electronically Signed   By: Amie Portlandavid  Ormond M.D.   On: 10/04/2013 07:53     EKG Interpretation None      MDM   Final diagnoses:  Left nephrolithiasis    Rodney Dominguez is a 43 y.o. male with a PMHx of HTN and HLD who presents with 2 weeks of intermittent L flank pain that worsened 4 days ago, associated with N/V. No symptoms of infection reported. Pt afebrile, nontoxic appearing, with +CVA tenderness and mild L sided/LUQ abd TTP. DDx includes nephrolithiasis, pyelo, or UTI. Will obtain U/A, abd/pelvis CT w/o contrast, and give morphine and zofran and 1L bolus. Will reassess once results return.  8:30 AM U/A showing trace Hgb and trace leuks with no other signs of UTI. CT showing 2.4cm stone at the L renal ureteropelvic junction and associated hydronephrosis and perinephric stranding. Will  obtain basic labs to eval renal function. Pt reports that pain is tolerable with Morphine but has returned now. Will give Dilaudid 1mg  now and reassess. Consider consulting urology due to impressive size of this stone.  9:39 AM Pt endorses improved pain control with Dilaudid 1mg , now states it's a 2/10, but is beginning to creep up again. At this time, I don't believe pt will be able to control pain with PO medications, therefore will consult Urology and get recommendations regarding admission vs outpt therapy. Will reassess.  9:56 AM Potassium 2.9. Will replete orally.  10:18 AM Dr. Mena GoesEskridge from urology spoke with Ebbie Ridgehris Lawyer, PA-C. Advised to give PO pain meds and discharge home, with instructions to call the office today to get an appt. I explained this to the pt, and have given explicit precautions to return to the ER including for any other new or worsening symptoms. The patient understands  and accepts the medical plan as it's been dictated and I have answered their questions. Discharge instructions concerning home care and prescriptions have been given. The patient is STABLE and is discharged to home in good condition.  BP 123/84  Pulse 89  Temp(Src) 98.2 F (36.8 C) (Oral)  Resp 11  Wt 236 lb (107.049 kg)  SpO2 95%   Mercedes Strupp Camprubi-Soms, PA-C 10/04/13 1049

## 2013-10-04 NOTE — ED Notes (Addendum)
Pt in stating over the last few weeks he has been having intermittent left lower flank pain and nausea, occasional vomiting, states symptoms would resolve and stay away for a few days, tonight symptoms worsened and pain is increased- pt states pain has improved at this time

## 2013-10-04 NOTE — ED Provider Notes (Signed)
Medical screening examination/treatment/procedure(s) were performed by non-physician practitioner and as supervising physician I was immediately available for consultation/collaboration.   EKG Interpretation None       Ethelda ChickMartha K Linker, MD 10/04/13 1103

## 2013-10-04 NOTE — ED Notes (Signed)
Pt c/o intermittent flank pain that started a few weeks ago. Pt has had some nausea/ vomiting and diarrhea. Pt states last episode of diarrhea was on Saturday, last episode of emesis was at midnight. Pt rates pain at this time a 9/10. Pt didn't take anything for his pain. Denies any activity that may have brought his sx on.

## 2013-10-07 ENCOUNTER — Other Ambulatory Visit: Payer: Self-pay | Admitting: Urology

## 2013-10-13 ENCOUNTER — Encounter (HOSPITAL_COMMUNITY): Payer: Self-pay | Admitting: Pharmacy Technician

## 2013-10-20 ENCOUNTER — Ambulatory Visit (HOSPITAL_COMMUNITY)
Admission: RE | Admit: 2013-10-20 | Discharge: 2013-10-20 | Disposition: A | Discharge status: Home / Self Care | Payer: BC Managed Care – PPO | Source: Ambulatory Visit | Attending: Anesthesiology | Admitting: Anesthesiology

## 2013-10-20 ENCOUNTER — Encounter (HOSPITAL_COMMUNITY)
Admission: RE | Admit: 2013-10-20 | Discharge: 2013-10-20 | Disposition: A | Discharge status: Home / Self Care | Payer: BC Managed Care – PPO | Source: Ambulatory Visit | Attending: Urology | Admitting: Urology

## 2013-10-20 ENCOUNTER — Encounter (HOSPITAL_COMMUNITY): Payer: Self-pay

## 2013-10-20 DIAGNOSIS — Z01810 Encounter for preprocedural cardiovascular examination: Secondary | ICD-10-CM | POA: Insufficient documentation

## 2013-10-20 DIAGNOSIS — I10 Essential (primary) hypertension: Secondary | ICD-10-CM | POA: Insufficient documentation

## 2013-10-20 DIAGNOSIS — Z01812 Encounter for preprocedural laboratory examination: Secondary | ICD-10-CM | POA: Insufficient documentation

## 2013-10-20 DIAGNOSIS — N200 Calculus of kidney: Secondary | ICD-10-CM | POA: Insufficient documentation

## 2013-10-20 DIAGNOSIS — Z01818 Encounter for other preprocedural examination: Secondary | ICD-10-CM | POA: Insufficient documentation

## 2013-10-20 HISTORY — DX: Calculus of kidney: N20.0

## 2013-10-20 LAB — CBC
HEMATOCRIT: 38.3 % — AB (ref 39.0–52.0)
HEMOGLOBIN: 13.4 g/dL (ref 13.0–17.0)
MCH: 30.3 pg (ref 26.0–34.0)
MCHC: 35 g/dL (ref 30.0–36.0)
MCV: 86.7 fL (ref 78.0–100.0)
Platelets: 277 10*3/uL (ref 150–400)
RBC: 4.42 MIL/uL (ref 4.22–5.81)
RDW: 13.1 % (ref 11.5–15.5)
WBC: 7.1 10*3/uL (ref 4.0–10.5)

## 2013-10-20 LAB — BASIC METABOLIC PANEL
Anion gap: 15 (ref 5–15)
BUN: 18 mg/dL (ref 6–23)
CALCIUM: 9.3 mg/dL (ref 8.4–10.5)
CO2: 26 mEq/L (ref 19–32)
Chloride: 98 mEq/L (ref 96–112)
Creatinine, Ser: 1.24 mg/dL (ref 0.50–1.35)
GFR calc Af Amer: 81 mL/min — ABNORMAL LOW (ref >90)
GFR, EST NON AFRICAN AMERICAN: 70 mL/min — AB (ref >90)
Glucose, Bld: 118 mg/dL — ABNORMAL HIGH (ref 70–99)
POTASSIUM: 3.2 meq/L — AB (ref 3.7–5.3)
Sodium: 139 mEq/L (ref 137–147)

## 2013-10-20 NOTE — Pre-Procedure Instructions (Signed)
10-20-13 EKG/ CXR done today.

## 2013-10-20 NOTE — Patient Instructions (Addendum)
20 Kolbie Lepkowski  10/20/2013   Your procedure is scheduled on:  7-17 -2015  Enter through [facility] Entrance and follow signs to [facility]. Arrive at   0945     AM.  Call this number if you have problems the morning of surgery: [phone]  Or Presurgical Testing [phone](Thaily Hackworth) For Living Will and/or Health Care Power Attorney Forms: please provide copy for your medical record,may bring AM of surgery(Forms should be already notarized -we do not provide this service).(10-20-13  No information preferred today).   For Cpap use: Bring mask and tubing only.   Do not eat food:After Midnight.  May have clear liquids:up to 6 Hours before arrival. Nothing after :  Clear liquids include soda, tea, black coffee, apple or grape juice, broth.  Take these medicines the morning of surgery with A SIP OF WATER: Atorvastatin. Diltiazem.   Do not wear jewelry, make-up or nail polish.  Do not wear lotions, powders, or perfumes. You may wear deodorant.  Do not shave 48 hours(2 days) prior to first CHG shower(legs and under arms).(Shaving face and neck okay.)  Do not bring valuables to the hospital.(Hospital is not responsible for lost valuables).  Contacts, dentures or removable bridgework, body piercing, hair pins may not be worn into surgery.  Leave suitcase in the car. After surgery it may be brought to your room.  For patients admitted to the hospital, checkout time is 11:00 AM the day of discharge.(Restricted visitors-Any Persons displaying flu-like symptoms or illness).    Patients discharged the day of surgery will not be allowed to drive home. Must have responsible person with you x 24 hours once discharged.  Name and phone number of your driver: Anson Fret [phone] cell  Special Instructions: CHG(Chlorhedine 4%-"Hibiclens","Betasept","Aplicare") Shower Use Special Wash: see special instructions.(avoid face and  genitals)       ____________________________    [facility] - Preparing for Surgery Before surgery, you can play an important role.  Because skin is not sterile, your skin needs to be as free of germs as possible.  You can reduce the number of germs on your skin by washing with CHG (chlorahexidine gluconate) soap before surgery.  CHG is an antiseptic cleaner which kills germs and bonds with the skin to continue killing germs even after washing. Please DO NOT use if you have an allergy to CHG or antibacterial soaps.  If your skin becomes reddened/irritated stop using the CHG and inform your nurse when you arrive at Short Stay. Do not shave (including legs and underarms) for at least 48 hours prior to the first CHG shower.  You may shave your face/neck. Please follow these instructions carefully:  1.  Shower with CHG Soap the night before surgery and the  morning of Surgery.  2.  If you choose to wash your hair, wash your hair first as usual with your  normal  shampoo.  3.  After you shampoo, rinse your hair and body thoroughly to remove the  shampoo.                           4.  Use CHG as you would any other liquid soap.  You can apply chg directly  to the skin and wash                       Gently with a scrungie or clean washcloth.  5.  Apply the CHG  Soap to your body ONLY FROM THE NECK DOWN.   Do not use on face/ open                           Wound or open sores. Avoid contact with eyes, ears mouth and genitals (private parts).                       Wash face,  Genitals (private parts) with your normal soap.             6.  Wash thoroughly, paying special attention to the area where your surgery  will be performed.  7.  Thoroughly rinse your body with warm water from the neck down.  8.  DO NOT shower/wash with your normal soap after using and rinsing off  the CHG Soap.                9.  Pat yourself dry with a clean towel.            10.  Wear clean pajamas.            11.  Place clean  sheets on your bed the night of your first shower and do not  sleep with pets. Day of Surgery : Do not apply any lotions/deodorants the morning of surgery.  Please wear clean clothes to the hospital/surgery center.  FAILURE TO FOLLOW THESE INSTRUCTIONS MAY RESULT IN THE CANCELLATION OF YOUR SURGERY PATIENT SIGNATURE_________________________________  NURSE SIGNATURE__________________________________  ________________________________________________________________________

## 2013-10-28 ENCOUNTER — Observation Stay (HOSPITAL_COMMUNITY)
Admission: RE | Admit: 2013-10-28 | Discharge: 2013-10-29 | Disposition: A | Discharge status: Home / Self Care | Payer: BC Managed Care – PPO | Source: Ambulatory Visit | Attending: Urology | Admitting: Urology

## 2013-10-28 ENCOUNTER — Ambulatory Visit (HOSPITAL_COMMUNITY): Payer: BC Managed Care – PPO | Admitting: *Deleted

## 2013-10-28 ENCOUNTER — Ambulatory Visit (HOSPITAL_COMMUNITY): Payer: BC Managed Care – PPO

## 2013-10-28 ENCOUNTER — Encounter (HOSPITAL_COMMUNITY): Payer: BC Managed Care – PPO | Admitting: *Deleted

## 2013-10-28 ENCOUNTER — Encounter (HOSPITAL_COMMUNITY)
Admission: RE | Disposition: A | Discharge status: Home / Self Care | Payer: Self-pay | Source: Ambulatory Visit | Attending: Urology

## 2013-10-28 ENCOUNTER — Encounter (HOSPITAL_COMMUNITY): Payer: Self-pay | Admitting: *Deleted

## 2013-10-28 DIAGNOSIS — Z87891 Personal history of nicotine dependence: Secondary | ICD-10-CM | POA: Insufficient documentation

## 2013-10-28 DIAGNOSIS — N1330 Unspecified hydronephrosis: Secondary | ICD-10-CM | POA: Insufficient documentation

## 2013-10-28 DIAGNOSIS — N200 Calculus of kidney: Principal | ICD-10-CM | POA: Insufficient documentation

## 2013-10-28 DIAGNOSIS — E7800 Pure hypercholesterolemia, unspecified: Secondary | ICD-10-CM | POA: Insufficient documentation

## 2013-10-28 DIAGNOSIS — E785 Hyperlipidemia, unspecified: Secondary | ICD-10-CM | POA: Insufficient documentation

## 2013-10-28 DIAGNOSIS — I10 Essential (primary) hypertension: Secondary | ICD-10-CM | POA: Insufficient documentation

## 2013-10-28 DIAGNOSIS — G4730 Sleep apnea, unspecified: Secondary | ICD-10-CM | POA: Insufficient documentation

## 2013-10-28 DIAGNOSIS — E119 Type 2 diabetes mellitus without complications: Secondary | ICD-10-CM | POA: Insufficient documentation

## 2013-10-28 DIAGNOSIS — E669 Obesity, unspecified: Secondary | ICD-10-CM | POA: Insufficient documentation

## 2013-10-28 HISTORY — PX: CYSTOSCOPY W/ RETROGRADES: SHX1426

## 2013-10-28 HISTORY — PX: NEPHROLITHOTOMY: SHX5134

## 2013-10-28 LAB — GLUCOSE, CAPILLARY
GLUCOSE-CAPILLARY: 102 mg/dL — AB (ref 70–99)
Glucose-Capillary: 99 mg/dL (ref 70–99)

## 2013-10-28 LAB — HEMOGLOBIN AND HEMATOCRIT, BLOOD
HCT: 33.7 % — ABNORMAL LOW (ref 39.0–52.0)
HEMOGLOBIN: 11.6 g/dL — AB (ref 13.0–17.0)

## 2013-10-28 SURGERY — NEPHROLITHOTOMY PERCUTANEOUS
Anesthesia: General | Laterality: Left

## 2013-10-28 MED ORDER — MIDAZOLAM HCL 5 MG/5ML IJ SOLN
INTRAMUSCULAR | Status: DC | PRN
  Administered 2013-10-28: 2 mg via INTRAVENOUS

## 2013-10-28 MED ORDER — ONDANSETRON HCL 4 MG/2ML IJ SOLN
INTRAMUSCULAR | Status: DC | PRN
  Administered 2013-10-28: 4 mg via INTRAVENOUS

## 2013-10-28 MED ORDER — LIDOCAINE HCL (CARDIAC) 20 MG/ML IV SOLN
INTRAVENOUS | Status: DC | PRN
  Administered 2013-10-28: 50 mg via INTRAVENOUS

## 2013-10-28 MED ORDER — EPHEDRINE SULFATE 50 MG/ML IJ SOLN
INTRAMUSCULAR | Status: DC | PRN
  Administered 2013-10-28 (×4): 10 mg via INTRAVENOUS

## 2013-10-28 MED ORDER — OXYCODONE HCL 5 MG PO TABS: 5.0000 mg | ORAL_TABLET | ORAL | Status: DC | PRN

## 2013-10-28 MED ORDER — SODIUM CHLORIDE 0.9 % IJ SOLN
INTRAMUSCULAR | Status: AC
  Filled 2013-10-28: qty 10

## 2013-10-28 MED ORDER — SODIUM CHLORIDE 0.9 % IR SOLN
Status: DC | PRN
  Administered 2013-10-28: 15000 mL

## 2013-10-28 MED ORDER — SUFENTANIL CITRATE 50 MCG/ML IV SOLN
INTRAVENOUS | Status: AC
  Filled 2013-10-28: qty 1

## 2013-10-28 MED ORDER — SUFENTANIL CITRATE 50 MCG/ML IV SOLN
INTRAVENOUS | Status: DC | PRN
  Administered 2013-10-28: 10 ug via INTRAVENOUS
  Administered 2013-10-28: 5 ug via INTRAVENOUS
  Administered 2013-10-28: 10 ug via INTRAVENOUS

## 2013-10-28 MED ORDER — DILTIAZEM HCL ER COATED BEADS 240 MG PO CP24
240.0000 mg | ORAL_CAPSULE | Freq: Every morning | ORAL | Status: DC
Start: 2013-10-29 — End: 2013-10-29
  Administered 2013-10-29: 240 mg via ORAL
  Filled 2013-10-28: qty 1

## 2013-10-28 MED ORDER — ACETAMINOPHEN 500 MG PO TABS
1000.0000 mg | ORAL_TABLET | Freq: Four times a day (QID) | ORAL | Status: DC
  Administered 2013-10-28 – 2013-10-29 (×3): 1000 mg via ORAL
  Filled 2013-10-28 (×4): qty 2

## 2013-10-28 MED ORDER — NEOSTIGMINE METHYLSULFATE 10 MG/10ML IV SOLN
INTRAVENOUS | Status: AC
  Filled 2013-10-28: qty 1

## 2013-10-28 MED ORDER — HYDROMORPHONE HCL PF 1 MG/ML IJ SOLN
0.2500 mg | INTRAMUSCULAR | Status: DC | PRN
  Administered 2013-10-28 (×2): 0.5 mg via INTRAVENOUS

## 2013-10-28 MED ORDER — NEOSTIGMINE METHYLSULFATE 10 MG/10ML IV SOLN
INTRAVENOUS | Status: DC | PRN
  Administered 2013-10-28: 5 mg via INTRAVENOUS

## 2013-10-28 MED ORDER — KCL IN DEXTROSE-NACL 20-5-0.45 MEQ/L-%-% IV SOLN
INTRAVENOUS | Status: DC
  Administered 2013-10-28 – 2013-10-29 (×2): via INTRAVENOUS
  Filled 2013-10-28 (×3): qty 1000

## 2013-10-28 MED ORDER — MIDAZOLAM HCL 2 MG/2ML IJ SOLN
INTRAMUSCULAR | Status: AC
  Filled 2013-10-28: qty 2

## 2013-10-28 MED ORDER — GENTAMICIN SULFATE 40 MG/ML IJ SOLN
5.0000 mg/kg | INTRAVENOUS | Status: AC
  Administered 2013-10-28: 460 mg via INTRAVENOUS
  Filled 2013-10-28: qty 11.5

## 2013-10-28 MED ORDER — PROMETHAZINE HCL 25 MG/ML IJ SOLN: 6.2500 mg | INTRAMUSCULAR | Status: DC | PRN

## 2013-10-28 MED ORDER — ROCURONIUM BROMIDE 100 MG/10ML IV SOLN
INTRAVENOUS | Status: DC | PRN
  Administered 2013-10-28: 50 mg via INTRAVENOUS
  Administered 2013-10-28: 30 mg via INTRAVENOUS

## 2013-10-28 MED ORDER — IOHEXOL 300 MG/ML  SOLN
INTRAMUSCULAR | Status: DC | PRN
  Administered 2013-10-28: 27 mL

## 2013-10-28 MED ORDER — HYDROMORPHONE HCL PF 1 MG/ML IJ SOLN
0.5000 mg | INTRAMUSCULAR | Status: DC | PRN
  Administered 2013-10-28 (×2): 1 mg via INTRAVENOUS
  Filled 2013-10-28 (×2): qty 1

## 2013-10-28 MED ORDER — METOCLOPRAMIDE HCL 5 MG/ML IJ SOLN
INTRAMUSCULAR | Status: DC | PRN
  Administered 2013-10-28: 10 mg via INTRAVENOUS

## 2013-10-28 MED ORDER — LACTATED RINGERS IV SOLN
INTRAVENOUS | Status: DC
  Administered 2013-10-28 (×2): via INTRAVENOUS
  Administered 2013-10-28: 1000 mL via INTRAVENOUS

## 2013-10-28 MED ORDER — CISATRACURIUM BESYLATE (PF) 10 MG/5ML IV SOLN
INTRAVENOUS | Status: DC | PRN
  Administered 2013-10-28: 4 mg via INTRAVENOUS
  Administered 2013-10-28: 2 mg via INTRAVENOUS

## 2013-10-28 MED ORDER — ONDANSETRON HCL 4 MG/2ML IJ SOLN: 4.0000 mg | INTRAMUSCULAR | Status: DC | PRN

## 2013-10-28 MED ORDER — OXYCODONE HCL 5 MG/5ML PO SOLN
5.0000 mg | Freq: Once | ORAL | Status: DC | PRN
  Filled 2013-10-28: qty 5

## 2013-10-28 MED ORDER — LIDOCAINE HCL (CARDIAC) 20 MG/ML IV SOLN
INTRAVENOUS | Status: AC
Start: 2013-10-28 — End: 2013-10-28
  Filled 2013-10-28: qty 5

## 2013-10-28 MED ORDER — GLYCOPYRROLATE 0.2 MG/ML IJ SOLN
INTRAMUSCULAR | Status: AC
  Filled 2013-10-28: qty 4

## 2013-10-28 MED ORDER — ROCURONIUM BROMIDE 100 MG/10ML IV SOLN
INTRAVENOUS | Status: AC
  Filled 2013-10-28: qty 1

## 2013-10-28 MED ORDER — GLYCOPYRROLATE 0.2 MG/ML IJ SOLN
INTRAMUSCULAR | Status: DC | PRN
  Administered 2013-10-28: .8 mg via INTRAVENOUS

## 2013-10-28 MED ORDER — ONDANSETRON HCL 4 MG/2ML IJ SOLN
INTRAMUSCULAR | Status: AC
  Filled 2013-10-28: qty 2

## 2013-10-28 MED ORDER — HYDROMORPHONE HCL PF 1 MG/ML IJ SOLN
INTRAMUSCULAR | Status: AC
  Filled 2013-10-28: qty 1

## 2013-10-28 MED ORDER — 0.9 % SODIUM CHLORIDE (POUR BTL) OPTIME
TOPICAL | Status: DC | PRN
  Administered 2013-10-28: 2000 mL

## 2013-10-28 MED ORDER — SENNA 8.6 MG PO TABS
1.0000 | ORAL_TABLET | Freq: Two times a day (BID) | ORAL | Status: DC
  Administered 2013-10-28 – 2013-10-29 (×2): 8.6 mg via ORAL
  Filled 2013-10-28 (×2): qty 1

## 2013-10-28 MED ORDER — MEPERIDINE HCL 50 MG/ML IJ SOLN
6.2500 mg | INTRAMUSCULAR | Status: DC | PRN
Start: 2013-10-28 — End: 2013-10-28

## 2013-10-28 MED ORDER — OXYCODONE HCL 5 MG PO TABS: 5.0000 mg | ORAL_TABLET | Freq: Once | ORAL | Status: DC | PRN

## 2013-10-28 MED ORDER — ATORVASTATIN CALCIUM 20 MG PO TABS
20.0000 mg | ORAL_TABLET | Freq: Every morning | ORAL | Status: DC
  Administered 2013-10-29: 20 mg via ORAL
  Filled 2013-10-28: qty 1

## 2013-10-28 MED ORDER — EPHEDRINE SULFATE 50 MG/ML IJ SOLN
INTRAMUSCULAR | Status: AC
  Filled 2013-10-28: qty 1

## 2013-10-28 MED ORDER — DOCUSATE SODIUM 100 MG PO CAPS
100.0000 mg | ORAL_CAPSULE | Freq: Two times a day (BID) | ORAL | Status: DC
  Administered 2013-10-28 – 2013-10-29 (×2): 100 mg via ORAL
  Filled 2013-10-28 (×3): qty 1

## 2013-10-28 MED ORDER — PROPOFOL 10 MG/ML IV BOLUS
INTRAVENOUS | Status: AC
  Filled 2013-10-28: qty 20

## 2013-10-28 MED ORDER — PROPOFOL 10 MG/ML IV BOLUS
INTRAVENOUS | Status: DC | PRN
  Administered 2013-10-28: 230 mg via INTRAVENOUS

## 2013-10-28 SURGICAL SUPPLY — 57 items
BAG URINE DRAINAGE (UROLOGICAL SUPPLIES) ×6 IMPLANT
BASKET ZERO TIP NITINOL 2.4FR (BASKET) ×6 IMPLANT
BENZOIN TINCTURE PRP APPL 2/3 (GAUZE/BANDAGES/DRESSINGS) ×3 IMPLANT
BLADE SURG 15 STRL LF DISP TIS (BLADE) ×1 IMPLANT
BLADE SURG 15 STRL SS (BLADE) ×2
CATH BEACON 5.038 65CM KMP-01 (CATHETERS) ×3 IMPLANT
CATH FOLEY 2W COUNCIL 20FR 5CC (CATHETERS) IMPLANT
CATH FOLEY 2WAY SLVR  5CC 16FR (CATHETERS) ×4
CATH FOLEY 2WAY SLVR 5CC 16FR (CATHETERS) ×2 IMPLANT
CATH INTERMIT  6FR 70CM (CATHETERS) ×3 IMPLANT
CATH ROBINSON RED A/P 20FR (CATHETERS) IMPLANT
CATH X-FORCE N30 NEPHROSTOMY (TUBING) ×3 IMPLANT
CHLORAPREP W/TINT 26ML (MISCELLANEOUS) ×6 IMPLANT
COVER SURGICAL LIGHT HANDLE (MISCELLANEOUS) ×3 IMPLANT
DEVICE INFLATION 20CC 30ATM (MISCELLANEOUS) ×3 IMPLANT
DRAPE C-ARM 42X120 X-RAY (DRAPES) ×3 IMPLANT
DRAPE CAMERA CLOSED 9X96 (DRAPES) ×6 IMPLANT
DRAPE LG THREE QUARTER DISP (DRAPES) ×3 IMPLANT
DRAPE LINGEMAN PERC (DRAPES) ×3 IMPLANT
DRAPE SURG IRRIG POUCH 19X23 (DRAPES) ×3 IMPLANT
DRSG PAD ABDOMINAL 8X10 ST (GAUZE/BANDAGES/DRESSINGS) ×6 IMPLANT
DRSG TEGADERM 8X12 (GAUZE/BANDAGES/DRESSINGS) ×6 IMPLANT
FIBER LASER FLEXIVA 1000 (UROLOGICAL SUPPLIES) IMPLANT
FIBER LASER FLEXIVA 550 (UROLOGICAL SUPPLIES) IMPLANT
GAUZE SPONGE 4X4 12PLY STRL (GAUZE/BANDAGES/DRESSINGS) ×3 IMPLANT
GLOVE BIOGEL M STRL SZ7.5 (GLOVE) ×9 IMPLANT
GOWN STRL REUS W/TWL XL LVL3 (GOWN DISPOSABLE) ×9 IMPLANT
GUIDEWIRE AMPLAZ .035X145 (WIRE) ×6 IMPLANT
GUIDEWIRE ANG ZIPWIRE 038X150 (WIRE) ×6 IMPLANT
GUIDEWIRE STR DUAL SENSOR (WIRE) ×6 IMPLANT
KIT BASIN OR (CUSTOM PROCEDURE TRAY) ×3 IMPLANT
MANIFOLD NEPTUNE II (INSTRUMENTS) ×3 IMPLANT
NEEDLE TROCAR 18X15 ECHO (NEEDLE) ×3 IMPLANT
NEEDLE TROCAR 18X20 (NEEDLE) IMPLANT
NS IRRIG 1000ML POUR BTL (IV SOLUTION) ×3 IMPLANT
PACK BASIC VI WITH GOWN DISP (CUSTOM PROCEDURE TRAY) ×3 IMPLANT
PACK CYSTO (CUSTOM PROCEDURE TRAY) ×3 IMPLANT
PROBE LITHOCLAST ULTRA 3.8X403 (UROLOGICAL SUPPLIES) ×3 IMPLANT
PROBE PNEUMATIC 1.0MMX570MM (UROLOGICAL SUPPLIES) ×6 IMPLANT
SET IRRIG Y TYPE TUR BLADDER L (SET/KITS/TRAYS/PACK) ×3 IMPLANT
SET WARMING FLUID IRRIGATION (MISCELLANEOUS) IMPLANT
SHEATH PEELAWAY SET 9 (SHEATH) ×3 IMPLANT
SPONGE GAUZE 4X4 12PLY (GAUZE/BANDAGES/DRESSINGS) ×2 IMPLANT
SPONGE LAP 4X18 X RAY DECT (DISPOSABLE) ×3 IMPLANT
STENT CONTOUR 6FRX26X.038 (STENTS) ×3 IMPLANT
STONE CATCHER W/TUBE ADAPTER (UROLOGICAL SUPPLIES) ×3 IMPLANT
SURGIFLO W/THROMBIN 8M KIT (HEMOSTASIS) ×3 IMPLANT
SUT SILK 2 0 30  PSL (SUTURE) ×2
SUT SILK 2 0 30 PSL (SUTURE) ×1 IMPLANT
SUT VIC AB 3-0 SH 27 (SUTURE) ×2
SUT VIC AB 3-0 SH 27X BRD (SUTURE) ×1 IMPLANT
SYRINGE 12CC LL (MISCELLANEOUS) ×3 IMPLANT
SYRINGE 20CC LL (MISCELLANEOUS) ×6 IMPLANT
TOWEL OR 17X26 10 PK STRL BLUE (TOWEL DISPOSABLE) ×3 IMPLANT
TUBING CONNECTING 10 (TUBING) ×6 IMPLANT
TUBING CONNECTING 10' (TUBING) ×3
WATER STERILE IRR 1500ML POUR (IV SOLUTION) ×3 IMPLANT

## 2013-10-28 NOTE — H&P (Signed)
Rodney Dominguez is an 43 y.o. male.    Chief Complaint: Pre-Op Left Percutaneous Nephrostolithotomy  HPI:   1 - Nephrolithiasis - CT 09/2013 with left 2.4cm renal pelvis stone with mod hydro ( SSD 14cm, 1300HU), 4mm rt lower pole intrarenal stone by ER eval. Appears likely ball-valve-effect on left.  PMH sig for HTN, HLD. No CV disease. No strong blood thinners.   Today Rodney Dominguez is seen to proceed with left percutaneous nephrostolithotomy. Most recent UCX negative.   Past Medical History  Diagnosis Date  . Hypertension   . Hypercholesteremia   . Diabetes mellitus without complication     type II  . Obesity   . Sleep apnea     cpap- settings 11  . Kidney stone     left -first ever; surgery planned    Past Surgical History  Procedure Laterality Date  . Wisdom tooth extraction      Family History  Problem Relation Age of Onset  . Hypertension Mother   . Hyperlipidemia Mother   . Cancer Mother   . Diabetes Mother   . Hypertension Father   . Hyperlipidemia Father    Social History:  reports that he quit smoking about 10 years ago. His smoking use included Pipe and Cigars. He does not have any smokeless tobacco history on file. He reports that he does not drink alcohol or use illicit drugs.  Allergies:  Allergies  Allergen Reactions  . Bee Venom Anaphylaxis  . Ace Inhibitors Cough    No prescriptions prior to admission    No results found for this or any previous visit (from the past 48 hour(s)). No results found.  Review of Systems  Constitutional: Negative.  Negative for fever and chills.  HENT: Negative.   Eyes: Negative.   Respiratory: Negative.   Cardiovascular: Negative.   Gastrointestinal: Negative.   Genitourinary: Negative.   Musculoskeletal: Negative.   Skin: Negative.   Neurological: Negative.   Endo/Heme/Allergies: Negative.   Psychiatric/Behavioral: Negative.     There were no vitals taken for this visit. Physical Exam  Constitutional: He is  oriented to person, place, and time. He appears well-developed and well-nourished.  HENT:  Head: Normocephalic and atraumatic.  Eyes: EOM are normal. Pupils are equal, round, and reactive to light.  Neck: Normal range of motion. Neck supple.  Cardiovascular: Normal rate and regular rhythm.   Respiratory: Effort normal and breath sounds normal.  GI: Soft. Bowel sounds are normal.  Genitourinary: Penis normal.  Neurological: He is alert and oriented to person, place, and time.  Skin: Skin is warm and dry.  Psychiatric: He has a normal mood and affect. His behavior is normal. Judgment and thought content normal.     Assessment/Plan  1 - Nephrolithiasis - Certainly needs left PCNL for large intermitantly obstructing left stone. Pt wants observation of Rt.   We rediscussed percutaneous nephrostolithotomy (PCNL) in detail including need for percutaneous access which is sometimes gained by the surgeon, and other times by radiology or through existing nephrostomy tubes if present. We specifically rediscussed that often times tubes remain in after surgery until we are confident all stone has been treated. We rementioned that staged surgery is needed in over 50% of cases of very large or complex stone. We then rediscussed general risks including bleeding, infection, damage to kidney / ureter / bladder, loss of kidney, as well as anesthetic risks and rare but serious surgical complications including DVT, PE, MI, and mortality.       Will Heinkel,  Shaylah Mcghie 10/28/2013, 7:09 AM

## 2013-10-28 NOTE — Anesthesia Preprocedure Evaluation (Signed)
Anesthesia Evaluation  Patient identified by MRN, date of birth, ID band Patient awake    Reviewed: Allergy & Precautions, H&P , NPO status , Patient's Chart, lab work & pertinent test results  Airway Mallampati: II TM Distance: >3 FB Neck ROM: Full    Dental  (+) Dental Advisory Given   Pulmonary sleep apnea , former smoker,  breath sounds clear to auscultation        Cardiovascular hypertension, Pt. on medications Rhythm:Regular Rate:Normal     Neuro/Psych negative neurological ROS  negative psych ROS   GI/Hepatic negative GI ROS, Neg liver ROS,   Endo/Other  negative endocrine ROSdiabetes, Type 2  Renal/GU Renal disease     Musculoskeletal negative musculoskeletal ROS (+)   Abdominal   Peds  Hematology negative hematology ROS (+)   Anesthesia Other Findings   Reproductive/Obstetrics                           Anesthesia Physical Anesthesia Plan  ASA: II  Anesthesia Plan: General   Post-op Pain Management:    Induction: Intravenous  Airway Management Planned: Oral ETT  Additional Equipment:   Intra-op Plan:   Post-operative Plan: Extubation in OR  Informed Consent: I have reviewed the patients History and Physical, chart, labs and discussed the procedure including the risks, benefits and alternatives for the proposed anesthesia with the patient or authorized representative who has indicated his/her understanding and acceptance.   Dental advisory given  Plan Discussed with: CRNA  Anesthesia Plan Comments:         Anesthesia Quick Evaluation

## 2013-10-28 NOTE — Anesthesia Postprocedure Evaluation (Signed)
Anesthesia Post Note  Patient: Rodney Dominguez  Procedure(s) Performed: Procedure(s) (LRB): NEPHROLITHOTOMY PERCUTANEOUS WITH ACCESS (Left) CYSTOSCOPY WITH RETROGRADE PYELOGRAM LEFT URETEROSCOPY AND STENT PLACEMENT (Left)  Anesthesia type: General  Patient location: PACU  Post pain: Pain level controlled  Post assessment: Post-op Vital signs reviewed  Last Vitals: BP 133/83  Pulse 67  Temp(Src) 36.6 C (Oral)  Resp 18  Ht 6' (1.829 m)  Wt 247 lb (112.038 kg)  BMI 33.49 kg/m2  SpO2 98%  Post vital signs: Reviewed  Level of consciousness: sedated  Complications: No apparent anesthesia complications

## 2013-10-28 NOTE — Brief Op Note (Signed)
10/28/2013  3:11 PM  PATIENT:  Rodney Dominguez  43 y.o. male  PRE-OPERATIVE DIAGNOSIS:  LARGE LEFT KIDNEY STONE  POST-OPERATIVE DIAGNOSIS:  LARGE LEFT KIDNEY STONE  PROCEDURE:  Procedure(s): NEPHROLITHOTOMY PERCUTANEOUS WITH ACCESS (Left) CYSTOSCOPY WITH RETROGRADE PYELOGRAM (Left)  SURGEON:  Surgeon(s) and Role:    * Sebastian Acheheodore Kamillah Didonato, MD - Primary  PHYSICIAN ASSISTANT:   ASSISTANTS: none   ANESTHESIA:   general  EBL:  Total I/O In: 1000 [I.V.:1000] Out: 50 [Blood:50]  BLOOD ADMINISTERED:none  DRAINS: foley to straight drain   LOCAL MEDICATIONS USED:  NONE  SPECIMEN:  Source of Specimen:  Left Renal Stone Fragments  DISPOSITION OF SPECIMEN:  [facility] for compositional analysis  COUNTS:  YES  TOURNIQUET:  * No tourniquets in log *  DICTATION: .Other Dictation: Dictation Number [id number]  PLAN OF CARE: Admit for overnight observation  PATIENT DISPOSITION:  PACU - hemodynamically stable.   Delay start of Pharmacological VTE agent (>24hrs) due to surgical blood loss or risk of bleeding: yes

## 2013-10-28 NOTE — Transfer of Care (Signed)
Immediate Anesthesia Transfer of Care Note  Patient: Rodney RutterGregory Dominguez  Procedure(s) Performed: Procedure(s): NEPHROLITHOTOMY PERCUTANEOUS WITH ACCESS (Left) CYSTOSCOPY WITH RETROGRADE PYELOGRAM LEFT URETEROSCOPY AND STENT PLACEMENT (Left)  Patient Location: PACU  Anesthesia Type:General  Level of Consciousness: awake, alert , oriented and patient cooperative  Airway & Oxygen Therapy: Patient Spontanous Breathing and Patient connected to face mask oxygen  Post-op Assessment: Report given to PACU RN, Post -op Vital signs reviewed and stable and Patient moving all extremities  Post vital signs: Reviewed and stable  Complications: No apparent anesthesia complications

## 2013-10-29 LAB — BASIC METABOLIC PANEL
ANION GAP: 11 (ref 5–15)
BUN: 9 mg/dL (ref 6–23)
CALCIUM: 8.5 mg/dL (ref 8.4–10.5)
CHLORIDE: 99 meq/L (ref 96–112)
CO2: 27 mEq/L (ref 19–32)
Creatinine, Ser: 0.78 mg/dL (ref 0.50–1.35)
GFR calc Af Amer: >90 mL/min (ref >90)
GFR calc non Af Amer: >90 mL/min (ref >90)
Glucose, Bld: 111 mg/dL — ABNORMAL HIGH (ref 70–99)
Potassium: 3.3 mEq/L — ABNORMAL LOW (ref 3.7–5.3)
SODIUM: 137 meq/L (ref 137–147)

## 2013-10-29 LAB — HEMOGLOBIN AND HEMATOCRIT, BLOOD
HCT: 32.1 % — ABNORMAL LOW (ref 39.0–52.0)
Hemoglobin: 11.1 g/dL — ABNORMAL LOW (ref 13.0–17.0)

## 2013-10-29 MED ORDER — OXYCODONE-ACETAMINOPHEN 5-325 MG PO TABS: 1.0000 | ORAL_TABLET | Freq: Four times a day (QID) | ORAL | Status: DC | PRN

## 2013-10-29 MED ORDER — SULFAMETHOXAZOLE-TMP DS 800-160 MG PO TABS: 1.0000 | ORAL_TABLET | Freq: Two times a day (BID) | ORAL | Status: DC

## 2013-10-29 MED ORDER — SENNOSIDES-DOCUSATE SODIUM 8.6-50 MG PO TABS: 1.0000 | ORAL_TABLET | Freq: Two times a day (BID) | ORAL | Status: DC

## 2013-10-29 MED ORDER — OXYBUTYNIN CHLORIDE 5 MG PO TABS: 5.0000 mg | ORAL_TABLET | Freq: Three times a day (TID) | ORAL | Status: DC | PRN

## 2013-10-29 NOTE — Discharge Summary (Signed)
Physician Discharge Summary  Patient ID: Rodney RutterGregory Pereda MRN: [id number] DOB/AGE: 43-Sep-1972 43 y.o.  Admit date: 10/28/2013 Discharge date: 10/29/2013  Admission Diagnoses: Left Large Renal Stone  Discharge Diagnoses:  Active Problems:   Staghorn calculus   Discharged Condition: good  Hospital Course:   1 - Left Large Renal Stone - Underwent left percutaneous nephrostolithootmy and ureteral stent placement on 10/28/13 without acute complications. Admitted overnight for observation. By 7/18, the day of discharge, pt ambulatory, pain controlled on PO meds, afebrile, voiding following foley removal, Hgb >10, Cr <1, no wound problems, and felt to be adequate for discharge.   Consults: None  Significant Diagnostic Studies: labs: Hgb 11.1, Cr 0.78 morning of discharge  Treatments: surgery:  left percutaneous nephrostolithootmy and ureteral stent placement on 10/28/13   Discharge Exam: Blood pressure 112/74, pulse 59, temperature 98.5 F (36.9 C), temperature source Oral, resp. rate 18, height 6' (1.829 m), weight 112.038 kg (247 lb), SpO2 97.00%. General appearance: alert, cooperative and appears stated age Head: Normocephalic, without obvious abnormality, atraumatic Nose: Nares normal. Septum midline. Mucosa normal. No drainage or sinus tenderness. Throat: lips, mucosa, and tongue normal; teeth and gums normal Neck: supple, symmetrical, trachea midline Back: symmetric, no curvature. ROM normal. No CVA tenderness. Resp: non-labored on room air Cardio: Nl rate GI: soft, non-tender; bowel sounds normal; no masses,  no organomegaly Male genitalia: normal Extremities: extremities normal, atraumatic, no cyanosis or edema Pulses: 2+ and symmetric Skin: Skin color, texture, turgor normal. No rashes or lesions Lymph nodes: Cervical, supraclavicular, and axillary nodes normal. Neurologic: Grossly normal Incision/Wound: Recent Left PCNL site c/d/i with dry dressing. NO large flank ecchymoses  or hematoma.  Disposition: 01-Home or Self Care     Medication List         aspirin EC 81 MG tablet  Take 81 mg by mouth every morning.     atorvastatin 20 MG tablet  Commonly known as:  LIPITOR  Take 20 mg by mouth every morning.     diltiazem 240 MG 24 hr capsule  Commonly known as:  CARDIZEM CD  Take 240 mg by mouth every morning.     EPIPEN 2-PAK 0.3 mg/0.3 mL Soaj injection  Generic drug:  EPINEPHrine  Inject 0.3 mg into the skin as needed (Allergic reaction).     multivitamin with minerals tablet  Take 1 tablet by mouth daily.     oxybutynin 5 MG tablet  Commonly known as:  DITROPAN  Take 1 tablet (5 mg total) by mouth every 8 (eight) hours as needed for bladder spasms. / stent discomfort     oxyCODONE-acetaminophen 5-325 MG per tablet  Commonly known as:  ROXICET  Take 1-2 tablets by mouth every 6 (six) hours as needed for moderate pain or severe pain. Post-operatively     senna-docusate 8.6-50 MG per tablet  Commonly known as:  Senokot-S  Take 1 tablet by mouth 2 (two) times daily. While taking pain meds / spasms meds to prevent constipation     sulfamethoxazole-trimethoprim 800-160 MG per tablet  Commonly known as:  BACTRIM DS  Take 1 tablet by mouth 2 (two) times daily. X 3 days. Begin day prior to next Urology appointment.     valsartan-hydrochlorothiazide 320-25 MG per tablet  Commonly known as:  DIOVAN-HCT  Take 1 tablet by mouth every morning.           Follow-up Information   Follow up with Sebastian AcheMANNY, Arlett Goold, MD On 11/14/2013. (at 8 AM for MD visit and office  stent removal)    Specialty:  Urology   Contact information:   [address] [place] [address] [phone]       Signed: Sebastian Ache 10/29/2013, 7:34 AM

## 2013-10-29 NOTE — Op Note (Signed)
NAMDebbra Riding:  Dominguez, Rodney Dominguez              ACCOUNT NO.:  [id number]  MEDICAL RECORD NO.:  [id number]  LOCATION:  1423                         [facility]  PHYSICIAN:  Sebastian Acheheodore Kelisha Dall, MD     DATE OF BIRTH:  08/18/1970  DATE OF PROCEDURE: 10/28/2013 DATE OF DISCHARGE:                              OPERATIVE REPORT   DIAGNOSIS:  Large left renal stone greater than 2 cm.  PROCEDURES: 1. Cystoscopy with left retrograde pyelogram and interpretation. 2. Insertion of left ureteral stent. 3. Left percutaneous nephrostolithotomy stone greater than 2 cm. 4. Percutaneous access to the left kidney. 5. Left diagnostic ureteroscopy.  ESTIMATED BLOOD LOSS:  100 mL.  COMPLICATIONS:  None.  SPECIMEN:  Left renal stone fragments for compositional analysis.  FINDINGS: 1. Unremarkable urinary bladder. 2. Left retrograde pyelogram with large filling defects in the renal     pelvis consistent with known stone with significant hydronephrosis. 3. Successful access to the kidney via a midpole posterior calyx. 4. Complete ablation of all stone fragments larger than 1/3 mm. 5. No evidence of ureteral stones on final diagnostic ureteroscopy. 6. No medial extravasation of contrast on final antegrade     nephrostogram.  INDICATION:  Rodney Dominguez is a pleasant 43 year old gentleman with history of prior nephrolithiasis who presented with colicky left flank pain, was found to have a very large renal pelvis stone approximately 2.5 cm as well as a very small 4-mm contralateral stone that was nonobstructing. He had hydronephrosis on the left as well.  Options were discussed for management including retrograde and antegrade surgery versus surveillance versus shockwave techniques and the patient was received with left antegrade percutaneous nephrostolithotomy.  Informed consent was obtained and placed in the medical record.  PROCEDURE IN DETAIL:  The patient being Rodney RutterGregory Dominguez was verified. Procedure being  cysto left stent, left percutaneous nephrostolithotomy was confirmed.  Procedure was carried out.  Time-out was performed. Intravenous antibiotics were administered.  General endotracheal anesthesia was introduced.  The patient was placed into a low lithotomy position, and sterile field was created by prepping and draping the patient's penis, perineum, and proximal thighs using iodine x3.  Next, cystourethroscopy was performed using 22-French rigid cystoscope with 12- degree offset lens.  Inspection of the anterior and posterior urethra unremarkable.  Inspection of the urinary bladder revealed no diverticula, calcifications, or papular lesions.  Ureteral orifices were in the normal anatomic position.  The left ureteral orifice was cannulated with a 6-French end-hole catheter and left retrograde pyelogram was obtained.  Left retrograde pyelogram demonstrated a single left ureter with single- system left kidney.  There was significant hydronephrosis and a large filling defect in the renal pelvis consistent with known stone.  A 0.038 Sensor wire was advanced at the level of the upper pole, over which, the 6-French open-ended catheter was carefully advanced acting as externalized nephroureteral stent.  A 16-French Foley catheter was placed per urethra to straight drain with 10 mL of sterile water in the balloon and the externalized nephroureteral stent was fashioned to this using silk tie, and connected to a contrast primed segment of IV extension tubing.  The patient was then repositioned into a prone position and pulling 15 degrees  stable flexion to maximize the space between his twelfth rib and iliac crest.  Sequential compression devices, prone view apparatus, chest rolls, axillary rolls, padding of his knees and ankles, he was found to be suitably positioned.  A new sterile field was created by completely prepping and draping the patient's entire left flank as well as the previously  placed nephroureteral stent into the operative field and percutaneous drape was applied.  Next, using simultaneous fluoroscopy and retrograde pyelography, a suitable calyx for percutaneous access was identified and a midpole calyx that appeared posterior with favorable angulation to the stone and area of the UPJ.  Using bull's eye technique, at 15 degrees off center, this calyx in question was cannulated with an 18-gauge Chiba needle from lateral approach and a zip wire was carefully advanced down to the level of ureter and exchanged for a superstiff wire via the KMP catheter.  Next, the dual-lumen coaxial dilator was carefully advanced at the level of proximal ureter, over which, an angle-tipped Glidewire was advanced at the level of the urinary bladder and exchanged via the KMP catheter for a second superstiff wire.  Incision was then extended for total distance approximately 2 cm and the 30-French NephroMax balloon dilation apparatus was carefully advanced across this calyx, inflated to a pressure of 18 atmospheres, held for 90 seconds and the sheath was advanced across the calyx.  Next, a rigid nephroscopy was performed.  This revealed excellent sheath placement and direct apposition of the large renal pelvis stone.  There was excellent hemostasis.  There was no evidence of renal perforation.  The stone appeared to be much too large for simple grasping.  As such, Lithoclast dual ultrasound pneumatic energy was applied to the stone for approximately 2 hours, completely ablating approximately 75% of the stone.  Remaining 25% having been turned into both of its smaller fragments.  These were then sequentially grasped.  The rigid graspers were brought on their entirety and set aside for compositional analysis. Following these maneuvers, all accessible stone via rigid technique had been removed and flexible nephroscopy was performed using a 16-French flexible cystoscope.  This revealed a  small volume residual stone and a upper pole calyx as well as a lower pole calyx, and these were grasped with a ZeroTip basket and brought out in their entirety.  Final nephroscopic examination revealed complete resolution of all stone fragments larger than 1/3 mm, no evidence of renal perforation.  The previously applied nephroureteral stent was removed to maximize space within the ureter and a 6-French non-digital ureteroscope was carefully advanced over one of the superstiff wires to the urinary bladder, and flexible ureteroscopy was performed.  Flexible ureteroscopy of the entire length of left ureter revealed no residual stone fragments and no evidence of mucosal abnormality.  Given complete resolution of stone, no evidence of stone in the ureter.  It was felt that proceeding with antegrade stenting and closure of the percutaneous tract would be safest way to proceed.  As such, a new 6 x 26 Contour-type stent was advanced in an antegrade fashion over the remaining superstiff wires at the level of the urinary bladder and carefully deployed.  Good proximal and distal deployment were noted. Final antegrade nephrostogram was performed.  Final antegrade nephrostogram revealed no evidence of medial extravasation, incomplete opacification from the level of the kidney to the level of the urinary bladder with mucosal abnormalities of the ureter.  The sheath was then removed and 10 mL of SurgiFlo was applied into the percutaneous  tract, which was then closed at the level of the skin using 3-0 interrupted Vicryl.  Hemostasis was excellent. Percutaneous drape was applied.  Procedure was then terminated.  The patient tolerated the procedure well.  There were no immediate periprocedural complications.  The patient was taken to the postanesthesia care unit in stable condition.          ______________________________ Sebastian Ache, MD     TM/MEDQ  D:  10/28/2013  T:  10/29/2013  Job:   [id number]

## 2013-10-29 NOTE — Discharge Instructions (Signed)
1 - You may have urinary urgency (bladder spasms) and bloody urine on / off with stent in place. This is normal. ° °2 - Call MD or go to ER for fever >102, severe pain / nausea / vomiting not relieved by medications, or acute change in medical status ° °

## 2013-11-01 ENCOUNTER — Encounter (HOSPITAL_COMMUNITY): Payer: Self-pay | Admitting: Urology

## 2013-12-20 ENCOUNTER — Encounter: Payer: Self-pay | Admitting: Cardiology

## 2013-12-26 ENCOUNTER — Ambulatory Visit: Payer: BC Managed Care – PPO | Admitting: Cardiology

## 2014-01-16 ENCOUNTER — Encounter: Payer: Self-pay | Admitting: Cardiology

## 2014-01-16 ENCOUNTER — Ambulatory Visit (INDEPENDENT_AMBULATORY_CARE_PROVIDER_SITE_OTHER): Payer: BC Managed Care – PPO | Admitting: Cardiology

## 2014-01-16 VITALS — BP 124/78 | HR 76 | Ht 72.0 in | Wt 244.2 lb

## 2014-01-16 DIAGNOSIS — G4733 Obstructive sleep apnea (adult) (pediatric): Secondary | ICD-10-CM

## 2014-01-16 DIAGNOSIS — I10 Essential (primary) hypertension: Secondary | ICD-10-CM

## 2014-01-16 DIAGNOSIS — E669 Obesity, unspecified: Secondary | ICD-10-CM

## 2014-01-16 NOTE — Patient Instructions (Signed)
Your physician recommends that you continue on your current medications as directed. Please refer to the Current Medication list given to you today.  Your physician wants you to follow-up in: 6 months with Dr Turner You will receive a reminder letter in the mail two months in advance. If you don't receive a letter, please call our office to schedule the follow-up appointment.  

## 2014-01-16 NOTE — Progress Notes (Signed)
[address] [place], [place]  [address] Phone: [phone] Fax:  [phone]  Date:  01/16/2014   ID:  Rodney Dominguez, DOB 1970/10/12, MRN [id number]  PCP:  Thora Lance, MD  Sleep Medicine:  Armanda Magic, MD    History of Present Illness: Rodney Dominguez is a 43 y.o. male with a history of OSA, HTN and obesity who presents today for followup. He is doing well. He tolerates his CPAP therapy with no problems. He feels rested in the am and has no daytime sleepiness when using his CPAP. He tolerates the nasal pillow mask and feels the pressure is adequate.  He denies any nasal congestion.  He occasionally will get off the CPAP for a trip for various reasons and then it takes him a week to get back on it.      Wt Readings from Last 3 Encounters:  01/16/14 244 lb 3.2 oz (110.768 kg)  10/28/13 247 lb (112.038 kg)  10/28/13 247 lb (112.038 kg)     Past Medical History  Diagnosis Date  . Hypertension   . Hypercholesteremia   . Diabetes mellitus without complication     type II  . Obesity   . Sleep apnea     cpap- settings 11  . Kidney stone     left -first ever; surgery planned    Current Outpatient Prescriptions  Medication Sig Dispense Refill  . aspirin EC 81 MG tablet Take 81 mg by mouth every morning.       Marland Kitchen atorvastatin (LIPITOR) 20 MG tablet Take 20 mg by mouth every morning.      . diltiazem (CARDIZEM CD) 240 MG 24 hr capsule Take 240 mg by mouth every morning.      Marland Kitchen EPIPEN 2-PAK 0.3 MG/0.3ML SOAJ Inject 0.3 mg into the skin as needed (Allergic reaction).       . Multiple Vitamins-Minerals (MULTIVITAMIN WITH MINERALS) tablet Take 1 tablet by mouth daily.      . valsartan-hydrochlorothiazide (DIOVAN-HCT) 320-25 MG per tablet Take 1 tablet by mouth every morning.        No current facility-administered medications for this visit.    Allergies:    Allergies  Allergen Reactions  . Bee Venom Anaphylaxis  . Ace Inhibitors Cough    Social History:   The patient  reports that he quit smoking about 10 years ago. His smoking use included Pipe and Cigars. He does not have any smokeless tobacco history on file. He reports that he does not drink alcohol or use illicit drugs.   Family History:  The patient's family history includes Cancer in his mother; Diabetes in his mother; Hyperlipidemia in his father and mother; Hypertension in his father and mother.   ROS:  Please see the history of present illness.      All other systems reviewed and negative.   PHYSICAL EXAM: VS:  BP 124/78  Pulse 76  Ht 6' (1.829 m)  Wt 244 lb 3.2 oz (110.768 kg)  BMI 33.11 kg/m2  SpO2 97% Well nourished, well developed, in no acute distress HEENT: normal Neck: no JVD Cardiac:  normal S1, S2; RRR; no murmur Lungs:  clear to auscultation bilaterally, no wheezing, rhonchi or rales Abd: soft, nontender, no hepatomegaly Ext: no edema Skin: warm and dry Neuro:  CNs 2-12 intact, no focal abnormalities noted     ASSESSMENT AND PLAN:  1. OSA on CPAP. His download today showed an AHI of 0.6/hr on 11cm H2O and 57% compliance  in using more than 4 hours nightly. I stressed the importance of being complaint with his CPAP. 2. Obesity- I have encouraged him to get into a routine exercise program.  I congratulated him on his 3lb weight loss with weight watchers 3. HTN - controlled - continue Diltiazem and Diovan HCT   Followup with me in 6 months   Signed, Armanda Magicraci Bobette Leyh, MD [facility] 01/16/2014 1:37 PM

## 2014-01-17 ENCOUNTER — Encounter: Payer: Self-pay | Admitting: Cardiology

## 2014-05-03 ENCOUNTER — Encounter: Payer: Self-pay | Admitting: Cardiology

## 2014-08-08 ENCOUNTER — Ambulatory Visit: Payer: Self-pay | Admitting: Cardiology

## 2014-09-14 NOTE — Progress Notes (Signed)
Cardiology Office Note   Date:  09/15/2014   ID:  Rodney Dominguez, DOB Nov 21, 1970, MRN [id number]  PCP:  Thora LanceEHINGER,ROBERT R, MD    Chief Complaint  Patient presents with  . Follow-up    OSA      History of Present Illness: Rodney RutterGregory Dominguez is a 44 y.o. male with a history of OSA, HTN and obesity who presents today for followup. He is doing well. He tolerates his CPAP therapy with no problems. He feels rested in the am and has no daytime sleepiness when using his CPAP. He tolerates the nasal pillow mask and feels the pressure is adequate. He denies any nasal congestion. He has gained 23 lbs since I saw him in the fall.        Past Medical History  Diagnosis Date  . Hypertension   . Hypercholesteremia   . Diabetes mellitus without complication     type II  . Obesity   . Sleep apnea     cpap- settings 11  . Kidney stone     left -first ever; surgery planned    Past Surgical History  Procedure Laterality Date  . Wisdom tooth extraction    . Nephrolithotomy Left 10/28/2013    Procedure: NEPHROLITHOTOMY PERCUTANEOUS WITH ACCESS;  Surgeon: Sebastian Acheheodore Manny, MD;  Location: [facility];  Service: Urology;  Laterality: Left;  . Cystoscopy w/ retrogrades Left 10/28/2013    Procedure: CYSTOSCOPY WITH RETROGRADE PYELOGRAM LEFT URETEROSCOPY AND STENT PLACEMENT;  Surgeon: Sebastian Acheheodore Manny, MD;  Location: [facility];  Service: Urology;  Laterality: Left;     Current Outpatient Prescriptions  Medication Sig Dispense Refill  . aspirin EC 81 MG tablet Take 81 mg by mouth every morning.     Marland Kitchen. atorvastatin (LIPITOR) 40 MG tablet Take 40 mg by mouth daily.  1  . diltiazem (CARDIZEM CD) 240 MG 24 hr capsule Take 240 mg by mouth every morning.    Marland Kitchen. EPIPEN 2-PAK 0.3 MG/0.3ML SOAJ Inject 0.3 mg into the skin as needed (Allergic reaction).     . metoprolol succinate (TOPROL-XL) 25 MG 24 hr tablet Take 25 mg by mouth daily.  0  . Multiple Vitamins-Minerals (MULTIVITAMIN WITH MINERALS)  tablet Take 1 tablet by mouth daily.    . valsartan-hydrochlorothiazide (DIOVAN-HCT) 320-25 MG per tablet Take 1 tablet by mouth every morning.      No current facility-administered medications for this visit.    Allergies:   Bee venom and Ace inhibitors    Social History:  The patient  reports that he quit smoking about 10 years ago. His smoking use included Pipe and Cigars. He does not have any smokeless tobacco history on file. He reports that he does not drink alcohol or use illicit drugs.   Family History:  The patient's family history includes Cancer in his mother; Diabetes in his mother; Hyperlipidemia in his father and mother; Hypertension in his father and mother.    ROS:  Please see the history of present illness.   Otherwise, review of systems are positive for none.   All other systems are reviewed and negative.    PHYSICAL EXAM: VS:  BP 126/88 mmHg  Pulse 67  Ht 6' (1.829 m)  Wt 267 lb 1.9 oz (121.165 kg)  BMI 36.22 kg/m2  SpO2 96% , BMI Body mass index is 36.22 kg/(m^2). GEN: Well nourished, well developed, in no acute distress HEENT: normal Neck: no  JVD, carotid bruits, or masses Cardiac: RRR; no murmurs, rubs, or gallops,no edema  Respiratory:  clear to auscultation bilaterally, normal work of breathing GI: soft, nontender, nondistended, + BS MS: no deformity or atrophy Skin: warm and dry, no rash Neuro:  Strength and sensation are intact Psych: euthymic mood, full affect   EKG:  EKG is not ordered today.    Recent Labs: 10/20/2013: Platelets 277 10/29/2013: BUN 9; Creatinine 0.78; Hemoglobin 11.1*; Potassium 3.3*; Sodium 137    Lipid Panel No results found for: CHOL, TRIG, HDL, CHOLHDL, VLDL, LDLCALC, LDLDIRECT    Wt Readings from Last 3 Encounters:  09/15/14 267 lb 1.9 oz (121.165 kg)  01/16/14 244 lb 3.2 oz (110.768 kg)  10/28/13 247 lb (112.038 kg)     ASSESSMENT AND PLAN:  1. OSA on CPAP. His download today showed an AHI of 0.6/hr on 11cm H2O  and 70% compliance in using more than 4 hours nightly.  2. Obesity- he has gained weight due to eating out too much.   I have encouraged him to get into a routine exercise program. He is back on weight watchers.   3. HTN - controlled - continue Diltiazem/Toprol and Diovan HCT     Current medicines are reviewed at length with the patient today.  The patient does not have concerns regarding medicines.  The following changes have been made:  no change  Labs/ tests ordered today: See above Assessment and Plan No orders of the defined types were placed in this encounter.     Disposition:   FU with me in 1 year  Signed, Quintella Reichert, MD  09/15/2014 9:07 AM    [facility] [address] [place], [place], [place]  [address] Phone: [phone]; Fax: [phone]

## 2014-09-15 ENCOUNTER — Encounter: Payer: Self-pay | Admitting: Cardiology

## 2014-09-15 ENCOUNTER — Ambulatory Visit (INDEPENDENT_AMBULATORY_CARE_PROVIDER_SITE_OTHER): Payer: BLUE CROSS/BLUE SHIELD | Admitting: Cardiology

## 2014-09-15 VITALS — BP 126/88 | HR 67 | Ht 72.0 in | Wt 267.1 lb

## 2014-09-15 DIAGNOSIS — E669 Obesity, unspecified: Secondary | ICD-10-CM | POA: Diagnosis not present

## 2014-09-15 DIAGNOSIS — I10 Essential (primary) hypertension: Secondary | ICD-10-CM

## 2014-09-15 DIAGNOSIS — G4733 Obstructive sleep apnea (adult) (pediatric): Secondary | ICD-10-CM | POA: Diagnosis not present

## 2014-09-15 NOTE — Patient Instructions (Signed)

## 2014-09-28 ENCOUNTER — Encounter: Payer: Self-pay | Admitting: Cardiology

## 2015-09-04 ENCOUNTER — Other Ambulatory Visit: Payer: Self-pay | Admitting: Urology

## 2015-09-05 ENCOUNTER — Encounter (HOSPITAL_COMMUNITY): Payer: Self-pay | Admitting: *Deleted

## 2015-09-05 NOTE — H&P (Signed)
History of Present Illness Patient with known history of right-sided calculus endorses right renal colic radiating from the flank on the same side around to the right testicle yesterday with intermittent nausea and vomiting. He noted no changes in voiding symptoms. He had leftover Percocet from his last stone event then used the medication to help alleviate his symptoms.His last episode of renal colic was early this morning around 4 AM. Denies fevers dysuria or hematuria. He is asymptomatic currently. He does note there is some residual right-sided flank soreness.   Past Medical History Problems  1. History of diabetes mellitus (Z86.39) 2. History of hyperlipidemia (Z86.39) 3. History of hypertension (Z86.79) 4. History of renal calculi (Z87.442) 5. History of sleep apnea (Z86.69)  Surgical History Problems  1. History of Anal Surgery 2. History of Oral Surgery Tooth Extraction 3. History of Percutaneous Lithotomy For Stone Over 2cm.  Current Meds 1. Aspirin 81 MG TABS;  Therapy: (Recorded:23Jun2015) to Recorded 2. Atorvastatin Calcium 40 MG Oral Tablet;  Therapy: (Recorded:08Sep2016) to Recorded 3. Cartia XT CP24;  Therapy: (Recorded:23Jun2015) to Recorded 4. CPAP;  Therapy: (Recorded:22May2017) to Recorded 5. Diovan 320 MG Oral Tablet;  Therapy: (Recorded:22May2017) to Recorded 6. Doxazosin Mesylate 4 MG Oral Tablet;  Therapy: (Recorded:22May2017) to Recorded 7. EpiPen 2-Pak 0.3 MG/0.3ML DEVI;  Therapy: (Recorded:22May2017) to Recorded 8. Metoprolol Tartrate 50 MG Oral Tablet;  Therapy: (Recorded:08Sep2016) to Recorded 9. Multi-Day Vitamins TABS;  Therapy: (Recorded:23Jun2015) to Recorded  Allergies Medication  1. ACE Inhibitors Non-Medication  2. Bee sting  Family History Problems  1. Family history of kidney stones (Z84.1) : Mother 2. Family history of malignant neoplasm of prostate (U27.25) : Maternal Grandfather 3. Family history of renal failure (Z84.1) :  Paternal Grandfather  Social History Problems  1. Denied: History of Alcohol use 2. Caffeine use (F15.90)   4 per day 3. Former smoker [phone])   quit in 2000, smoked for approx 5 yrs, occasional cigars 4. Married 5. Number of children   1 daughter 6. Occupation   Systems analyst  Review of Systems Genitourinary, constitutional, skin, eye, otolaryngeal, hematologic/lymphatic, cardiovascular, pulmonary, endocrine, musculoskeletal, gastrointestinal, neurological and psychiatric system(s) were reviewed and pertinent findings if present are noted and are otherwise negative.  Gastrointestinal: nausea, vomiting, flank pain and abdominal pain.    Vitals Vital Signs [Data Includes: Last 1 Day]  Recorded: 22May2017 03:00PM  Height: 6 ft  Weight: 270 lb  BMI Calculated: 36.62 BSA Calculated: 2.42 Blood Pressure: 111 / 72 Temperature: 98.1 F Heart Rate: 67  Physical Exam Constitutional: Well nourished and well developed . No acute distress.  ENT:. The ears and nose are normal in appearance.  Neck: The appearance of the neck is normal and no neck mass is present.  Pulmonary: No respiratory distress and normal respiratory rhythm and effort.  Cardiovascular: Heart rate and rhythm are normal . No peripheral edema.  Abdomen: The abdomen is soft and nontender. No masses are palpated. No CVA tenderness. No hernias are palpable. No hepatosplenomegaly noted.  Genitourinary: Examination of the penis demonstrates no discharge, no masses, no lesions and a normal meatus. The scrotum is without lesions. The right epididymis is palpably normal and non-tender. The left epididymis is palpably normal and non-tender. The right testis is non-tender and without masses. The left testis is non-tender and without masses.  Lymphatics: The femoral and inguinal nodes are not enlarged or tender.  Skin: Normal skin turgor, no visible rash and no visible skin lesions.  Neuro/Psych:. Mood and affect are  appropriate.  Results/Data Urine [Data Includes: Last 1 Day]   22May2017  COLOR YELLOW   APPEARANCE CLEAR   SPECIFIC GRAVITY 1.030   pH 5.5   GLUCOSE NEGATIVE   BILIRUBIN NEGATIVE   KETONE NEGATIVE   BLOOD NEGATIVE   PROTEIN NEGATIVE   NITRITE NEGATIVE   LEUKOCYTE ESTERASE NEGATIVE    The following images/tracing/specimen were independently visualized:  There is a rather prominent right-sided bowel gas pattern but on imaging today there appears to be a right proximal ureteral calculus consistent and shape of the previously noted calculus within the lower pole of the right renal shadow. Again there is a prominent bowel gas pattern which makes direct visualization of the right kidney challenging. This bowel gas pattern extends down into the right side of the pelvis. Bladder appears free of obstruction. No obvious left-sided obstructive uropathy. Bladder appears free of obstruction. I drew an arrow to indicate the calculus in question.  The following clinical lab reports were reviewed:  UA neg.    Assessment Assessed  1. Nephrolithiasis (N20.0)  Right proximal ureteral calculus. Appears to be the stone previously noted in his right kidney. 7 mm height x 4 mm width approximately.   Plan Health Maintenance  1. UA With REFLEX; [Do Not Release]; Status:Complete;   Done: 22May2017 02:50PM Nephrolithiasis  2. Start: OxyCODONE HCl - 10 MG Oral Tablet; Take 1/2-1 tablet every 4-6 hours for severe  pain 3. Start: Promethazine HCl - 25 MG Oral Tablet; TAKE 1 TABLET EVERY 6 HOURS AS  NEEDED FOR NAUSEA 4. Follow-up Schedule Surgery Office  Follow-up. I'll speak with Dr Berneice HeinrichManny for scheduling of  ESWL vs URS.  Status: Complete  Done: 22May2017 5. KUB; Status:Complete;   Done: 22May2017 03:47PM  I'll discuss with Dr Berneice HeinrichManny in the morning but noting patient's size, he may not be a good candidate for lithotripsy. I did go over the risks and benefits of both ureteroscopy and shockwave lithotripsy.  Patient voices understanding with all questions answered. He'll hold his NSAIDs and aspirin at this time until we know a definitive plan of care. He may be able to have lithotripsy this coming Thursday or Monday following. He'll remain on start his Prescriptions for oxycodone and promethazine. Appropriate follow-up instructions given for new or worsening symptoms. Patient reminded to remain well hydrated and nourished.   Signatures Electronically signed by : Anne FuLarry Gibson, Dyann RuddleANP-C; Sep 03 2015  5:44PM EST   Add; I reviewed notes, labs, imaging.

## 2015-09-06 ENCOUNTER — Encounter (HOSPITAL_COMMUNITY): Payer: Self-pay | Admitting: General Practice

## 2015-09-06 ENCOUNTER — Ambulatory Visit (HOSPITAL_COMMUNITY)
Admission: RE | Admit: 2015-09-06 | Discharge: 2015-09-06 | Disposition: A | Discharge status: Home / Self Care | Payer: BLUE CROSS/BLUE SHIELD | Source: Ambulatory Visit | Attending: Urology | Admitting: Urology

## 2015-09-06 ENCOUNTER — Encounter (HOSPITAL_COMMUNITY)
Admission: RE | Disposition: A | Discharge status: Home / Self Care | Payer: Self-pay | Source: Ambulatory Visit | Attending: Urology

## 2015-09-06 ENCOUNTER — Ambulatory Visit (HOSPITAL_COMMUNITY): Payer: BLUE CROSS/BLUE SHIELD

## 2015-09-06 DIAGNOSIS — Z87442 Personal history of urinary calculi: Secondary | ICD-10-CM | POA: Insufficient documentation

## 2015-09-06 DIAGNOSIS — E119 Type 2 diabetes mellitus without complications: Secondary | ICD-10-CM | POA: Insufficient documentation

## 2015-09-06 DIAGNOSIS — R112 Nausea with vomiting, unspecified: Secondary | ICD-10-CM | POA: Diagnosis present

## 2015-09-06 DIAGNOSIS — Z7982 Long term (current) use of aspirin: Secondary | ICD-10-CM | POA: Diagnosis not present

## 2015-09-06 DIAGNOSIS — E669 Obesity, unspecified: Secondary | ICD-10-CM | POA: Insufficient documentation

## 2015-09-06 DIAGNOSIS — G4730 Sleep apnea, unspecified: Secondary | ICD-10-CM | POA: Insufficient documentation

## 2015-09-06 DIAGNOSIS — Z87891 Personal history of nicotine dependence: Secondary | ICD-10-CM | POA: Insufficient documentation

## 2015-09-06 DIAGNOSIS — Z79899 Other long term (current) drug therapy: Secondary | ICD-10-CM | POA: Diagnosis not present

## 2015-09-06 DIAGNOSIS — Z841 Family history of disorders of kidney and ureter: Secondary | ICD-10-CM | POA: Insufficient documentation

## 2015-09-06 DIAGNOSIS — E785 Hyperlipidemia, unspecified: Secondary | ICD-10-CM | POA: Insufficient documentation

## 2015-09-06 DIAGNOSIS — N201 Calculus of ureter: Secondary | ICD-10-CM | POA: Diagnosis not present

## 2015-09-06 DIAGNOSIS — Z6836 Body mass index (BMI) 36.0-36.9, adult: Secondary | ICD-10-CM | POA: Insufficient documentation

## 2015-09-06 DIAGNOSIS — I10 Essential (primary) hypertension: Secondary | ICD-10-CM | POA: Diagnosis not present

## 2015-09-06 LAB — GLUCOSE, CAPILLARY: Glucose-Capillary: 124 mg/dL — ABNORMAL HIGH (ref 65–99)

## 2015-09-06 SURGERY — LITHOTRIPSY, ESWL
Anesthesia: LOCAL | Laterality: Right

## 2015-09-06 MED ORDER — CIPROFLOXACIN HCL 500 MG PO TABS
500.0000 mg | ORAL_TABLET | ORAL | Status: AC
  Administered 2015-09-06: 500 mg via ORAL
  Filled 2015-09-06: qty 1

## 2015-09-06 MED ORDER — DIPHENHYDRAMINE HCL 25 MG PO CAPS
25.0000 mg | ORAL_CAPSULE | ORAL | Status: AC
  Administered 2015-09-06: 25 mg via ORAL
  Filled 2015-09-06: qty 1

## 2015-09-06 MED ORDER — ASPIRIN EC 81 MG PO TBEC: 81.0000 mg | DELAYED_RELEASE_TABLET | Freq: Every morning | ORAL | Status: AC

## 2015-09-06 MED ORDER — SODIUM CHLORIDE 0.9 % IV SOLN
INTRAVENOUS | Status: DC
  Administered 2015-09-06: 10:00:00 via INTRAVENOUS

## 2015-09-06 MED ORDER — DIAZEPAM 5 MG PO TABS
10.0000 mg | ORAL_TABLET | ORAL | Status: AC
  Administered 2015-09-06: 10 mg via ORAL
  Filled 2015-09-06: qty 2

## 2015-09-06 NOTE — Interval H&P Note (Signed)
History and Physical Interval Note:  09/06/2015 12:12 PM  Rodney Dominguez  has presented today for surgery, with the diagnosis of RIGHT URETERAL STONE  The various methods of treatment have been discussed with the patient and family. After consideration of risks, benefits and other options for treatment, the patient has consented to  Procedure(s): RIGHT EXTRACORPOREAL SHOCK WAVE LITHOTRIPSY (ESWL) (Right) as a surgical intervention .  The patient's history has been reviewed, patient examined, no change in status, stable for surgery.  I have reviewed the patient's chart and labs.  Questions were answered to the patient's satisfaction.  Stone remains in right proximal ureter on KUB.    Trenton Passow

## 2015-09-06 NOTE — Discharge Instructions (Signed)
Lithotripsy, Care After °Refer to this sheet in the next few weeks. These instructions provide you with information on caring for yourself after your procedure. Your health care provider may also give you more specific instructions. Your treatment has been planned according to current medical practices, but problems sometimes occur. Call your health care provider if you have any problems or questions after your procedure. °WHAT TO EXPECT AFTER THE PROCEDURE  °· Your urine may have a red tinge for a few days after treatment. Blood loss is usually minimal. °· You may have soreness in the back or flank area. This usually goes away after a few days. The procedure can cause blotches or bruises on the back where the pressure wave enters the skin. These marks usually cause only minimal discomfort and should disappear in a short time. °· Stone fragments should begin to pass within 24 hours of treatment. However, a delayed passage is not unusual. °· You may have pain, discomfort, and feel sick to your stomach (nauseated) when the crushed fragments of stone are passed down the tube from the kidney to the bladder. Stone fragments can pass soon after the procedure and may last for up to 4-8 weeks. °· A small number of patients may have severe pain when stone fragments are not able to pass, which leads to an obstruction. °· If your stone is greater than 1 inch (2.5 cm) in diameter or if you have multiple stones that have a combined diameter greater than 1 inch (2.5 cm), you may require more than one treatment. °· If you had a stent placed prior to your procedure, you may experience some discomfort, especially during urination. You may experience the pain or discomfort in your flank or back, or you may experience a sharp pain or discomfort at the base of your penis or in your lower abdomen. The discomfort usually lasts only a few minutes after urinating. °HOME CARE INSTRUCTIONS  °· Rest at home until you feel your energy  improving. °· Only take over-the-counter or prescription medicines for pain, discomfort, or fever as directed by your health care provider. Depending on the type of lithotripsy, you may need to take antibiotics and anti-inflammatory medicines for a few days. °· Drink enough water and fluids to keep your urine clear or pale yellow. This helps "flush" your kidneys. It helps pass any remaining pieces of stone and prevents stones from coming back. °· Most people can resume daily activities within 1-2 days after standard lithotripsy. It can take longer to recover from laser and percutaneous lithotripsy. °· Strain all urine through the provided strainer. Keep all particulate matter and stones for your health care provider to see. The stone may be as small as a grain of salt. It is very important to use the strainer each and every time you pass your urine. Any stones that are found can be sent to a medical lab for examination. °· Visit your health care provider for a follow-up appointment in a few weeks. Your doctor may remove your stent if you have one. Your health care provider will also check to see whether stone particles still remain. °SEEK MEDICAL CARE IF:  °· Your pain is not relieved by medicine. °· You have a lasting nauseous feeling. °· You feel there is too much blood in the urine. °· You develop persistent problems with frequent or painful urination that does not at least partially improve after 2 days following the procedure. °· You have a congested cough. °· You feel   lightheaded. °· You develop a rash or any other signs that might suggest an allergic problem. °· You develop any reaction or side effects to your medicine(s). °SEEK IMMEDIATE MEDICAL CARE IF:  °· You experience severe back or flank pain or both. °· You see nothing but blood when you urinate. °· You cannot pass any urine at all. °· You have a fever or shaking chills. °· You develop shortness of breath, difficulty breathing, or chest pain. °· You  develop vomiting that will not stop after 6-8 hours. °· You have a fainting episode. °  °This information is not intended to replace advice given to you by your health care provider. Make sure you discuss any questions you have with your health care provider. °  °Document Released: 04/20/2007 Document Revised: 12/20/2014 Document Reviewed: 10/14/2012 °Elsevier Interactive Patient Education ©2016 [employer] ° °

## 2015-09-06 NOTE — Op Note (Signed)
Right proximal stone - 8 mm   Right ESWL  Findings: excellent fragmentation, pt tolerated well. Even on CPAP sats were 85 - 91. RR around 13, HR around 52.

## 2016-09-02 ENCOUNTER — Observation Stay (HOSPITAL_COMMUNITY)
Admission: EM | Admit: 2016-09-02 | Discharge: 2016-09-03 | Disposition: A | Discharge status: Home / Self Care | Payer: BLUE CROSS/BLUE SHIELD | Attending: Internal Medicine | Admitting: Internal Medicine

## 2016-09-02 ENCOUNTER — Emergency Department (HOSPITAL_COMMUNITY): Payer: BLUE CROSS/BLUE SHIELD

## 2016-09-02 ENCOUNTER — Telehealth: Payer: Self-pay | Admitting: Cardiology

## 2016-09-02 ENCOUNTER — Encounter (HOSPITAL_COMMUNITY): Payer: Self-pay

## 2016-09-02 DIAGNOSIS — R072 Precordial pain: Secondary | ICD-10-CM | POA: Diagnosis not present

## 2016-09-02 DIAGNOSIS — Z87891 Personal history of nicotine dependence: Secondary | ICD-10-CM | POA: Insufficient documentation

## 2016-09-02 DIAGNOSIS — I10 Essential (primary) hypertension: Secondary | ICD-10-CM | POA: Diagnosis not present

## 2016-09-02 DIAGNOSIS — Z7982 Long term (current) use of aspirin: Secondary | ICD-10-CM | POA: Diagnosis not present

## 2016-09-02 DIAGNOSIS — E119 Type 2 diabetes mellitus without complications: Secondary | ICD-10-CM | POA: Insufficient documentation

## 2016-09-02 DIAGNOSIS — Z79899 Other long term (current) drug therapy: Secondary | ICD-10-CM | POA: Diagnosis not present

## 2016-09-02 DIAGNOSIS — G4733 Obstructive sleep apnea (adult) (pediatric): Secondary | ICD-10-CM | POA: Diagnosis present

## 2016-09-02 DIAGNOSIS — R079 Chest pain, unspecified: Secondary | ICD-10-CM

## 2016-09-02 LAB — BASIC METABOLIC PANEL
Anion gap: 10 (ref 5–15)
BUN: 12 mg/dL (ref 6–20)
CALCIUM: 9.5 mg/dL (ref 8.9–10.3)
CHLORIDE: 104 mmol/L (ref 101–111)
CO2: 25 mmol/L (ref 22–32)
Creatinine, Ser: 0.86 mg/dL (ref 0.61–1.24)
GFR calc non Af Amer: >60 mL/min (ref >60)
GLUCOSE: 122 mg/dL — AB (ref 65–99)
POTASSIUM: 3 mmol/L — AB (ref 3.5–5.1)
Sodium: 139 mmol/L (ref 135–145)

## 2016-09-02 LAB — CBC
HEMATOCRIT: 38.3 % — AB (ref 39.0–52.0)
HEMOGLOBIN: 13.4 g/dL (ref 13.0–17.0)
MCH: 31.5 pg (ref 26.0–34.0)
MCHC: 35 g/dL (ref 30.0–36.0)
MCV: 90.1 fL (ref 78.0–100.0)
Platelets: 263 10*3/uL (ref 150–400)
RBC: 4.25 MIL/uL (ref 4.22–5.81)
RDW: 13.4 % (ref 11.5–15.5)
WBC: 5.5 10*3/uL (ref 4.0–10.5)

## 2016-09-02 LAB — D-DIMER, QUANTITATIVE (NOT AT ARMC)

## 2016-09-02 LAB — I-STAT TROPONIN, ED
Troponin i, poc: 0 ng/mL (ref 0.00–0.08)
Troponin i, poc: 0.01 ng/mL (ref 0.00–0.08)

## 2016-09-02 MED ORDER — GI COCKTAIL ~~LOC~~
30.0000 mL | Freq: Once | ORAL | Status: AC
  Administered 2016-09-02: 30 mL via ORAL
  Filled 2016-09-02: qty 30

## 2016-09-02 MED ORDER — POTASSIUM CHLORIDE CRYS ER 20 MEQ PO TBCR
40.0000 meq | EXTENDED_RELEASE_TABLET | Freq: Once | ORAL | Status: AC
  Administered 2016-09-02: 40 meq via ORAL
  Filled 2016-09-02: qty 2

## 2016-09-02 NOTE — ED Provider Notes (Signed)
[facility]-EMERGENCY DEPT Provider Note   CSN: [id number] Arrival date & time: 09/02/16  1252     History   Chief Complaint Chief Complaint  Patient presents with  . Chest Pain    HPI Rodney Dominguez is a 46 y.o. male who presents with 4 days of intermittent midsternal chest pain and shortness of breath. Patient states that he was initially sitting at home watching a movie when he started experiencing chest pain and shortness breath. He reports that the episodes lasted between 10-15 minutes. He denies any diaphoresis or nausea with the episodes. He states that if he lays still and rest the episodes will improve and resolve. Patient states that the episodes of shortness of breath and chest pain are worsened if he is up and walking and exerts himself. He has not tried any medications for the symptoms. He reports that he called his cardiologist today and was prompted to the emergency department for further evaluation. Patient denies any smoking history, cocaine/marijuana/heroine use. He does have a history of GERD and took over-the-counter antacids but is not on any prescription medication. He denies any recent heavy lifting or changes in his workout routine. He denies any family history of MIs. He denies any recent surgery, immobilization, travel, history of DVTs or PEs. He denies any recent illness, fevers, leg swelling, abdominal pain, nausea/vomiting.  The history is provided by the patient.    Past Medical History:  Diagnosis Date  . Diabetes mellitus without complication (HCC)    type II  . Hypercholesteremia   . Hypertension   . Kidney stone    left -first ever; surgery planned  . Obesity   . Sleep apnea    cpap- settings 11    Patient Active Problem List   Diagnosis Date Noted  . Chest pain 09/02/2016  . Staghorn calculus 10/28/2013  . Obstructive sleep apnea 06/20/2013  . Essential hypertension, benign 06/20/2013  . Obesity 06/20/2013    Past Surgical History:  Procedure  Laterality Date  . CYSTOSCOPY W/ RETROGRADES Left 10/28/2013   Procedure: CYSTOSCOPY WITH RETROGRADE PYELOGRAM LEFT URETEROSCOPY AND STENT PLACEMENT;  Surgeon: Sebastian Acheheodore Manny, MD;  Location: [facility];  Service: Urology;  Laterality: Left;  . NEPHROLITHOTOMY Left 10/28/2013   Procedure: NEPHROLITHOTOMY PERCUTANEOUS WITH ACCESS;  Surgeon: Sebastian Acheheodore Manny, MD;  Location: [facility];  Service: Urology;  Laterality: Left;  . WISDOM TOOTH EXTRACTION         Home Medications    Prior to Admission medications   Medication Sig Start Date End Date Taking? Authorizing Provider  aspirin EC 81 MG tablet Take 1 tablet (81 mg total) by mouth every morning. 09/08/15   Jerilee FieldEskridge, Matthew, MD  atorvastatin (LIPITOR) 40 MG tablet Take 40 mg by mouth daily. 09/12/14   [provider]  diltiazem (CARDIZEM CD) 240 MG 24 hr capsule Take 240 mg by mouth every morning.    [provider]  doxazosin (CARDURA) 4 MG tablet Take 4 mg by mouth daily.    [provider]  EPIPEN 2-PAK 0.3 MG/0.3ML SOAJ Inject 0.3 mg into the skin as needed (Allergic reaction).  08/02/12   [provider]  metoprolol succinate (TOPROL-XL) 25 MG 24 hr tablet Take 50 mg by mouth daily.  09/12/14   [provider]  Multiple Vitamins-Minerals (MULTIVITAMIN WITH MINERALS) tablet Take 1 tablet by mouth daily.    [provider]  Oxycodone HCl 10 MG TABS Take 10 mg by mouth.    [provider]  valsartan (DIOVAN)  320 MG tablet Take 320 mg by mouth daily. Started in April    [provider]    Family History Family History  Problem Relation Age of Onset  . Hypertension Mother   . Hyperlipidemia Mother   . Cancer Mother   . Diabetes Mother   . Hypertension Father   . Hyperlipidemia Father     Social History Social History  Substance Use Topics  . Smoking status: Former Smoker    Types: Pipe, Cigars    Quit date: 10/21/2003  . Smokeless tobacco: Never Used     Comment:  ocassionally smokes a cigar  . Alcohol use No     Comment: Heavy durin g college years, Quit '99     Allergies   Bee venom and Ace inhibitors   Review of Systems Review of Systems  Constitutional: Negative for chills and fever.  HENT: Negative for congestion, rhinorrhea and sore throat.   Respiratory: Positive for cough and shortness of breath.   Cardiovascular: Negative for chest pain.  Gastrointestinal: Negative for abdominal pain, blood in stool, diarrhea, nausea and vomiting.  Genitourinary: Negative for dysuria and hematuria.  Musculoskeletal: Negative for back pain and neck pain.  Skin: Negative for rash.  Neurological: Negative for dizziness, weakness, numbness and headaches.  All other systems reviewed and are negative.    Physical Exam Updated Vital Signs BP (!) 142/99   Pulse (!) 47   Temp 99.2 F (37.3 C) (Oral)   Resp 17   SpO2 96%   Physical Exam  Constitutional: He is oriented to person, place, and time. He appears well-developed and well-nourished.  Sitting comfortably on examination table  HENT:  Head: Normocephalic and atraumatic.  Mouth/Throat: Oropharynx is clear and moist and mucous membranes are normal.  Eyes: Conjunctivae, EOM and lids are normal. Pupils are equal, round, and reactive to light.  Neck: Full passive range of motion without pain.  Cardiovascular: Normal rate, regular rhythm, normal heart sounds and normal pulses.  Exam reveals no gallop and no friction rub.   No murmur heard. Pulses:      Radial pulses are 2+ on the right side, and 2+ on the left side.       Dorsalis pedis pulses are 2+ on the right side, and 2+ on the left side.  No bilateral lower extremity edema.  Pulmonary/Chest: Effort normal and breath sounds normal.  No evidence of respiratory distress. Able to speak in full sentences without difficulty.  Abdominal: Soft. Normal appearance. There is no tenderness. There is no rigidity and no guarding.  Musculoskeletal:  Normal range of motion.  Neurological: He is alert and oriented to person, place, and time.  Skin: Skin is warm and dry. Capillary refill takes less than 2 seconds.  Psychiatric: He has a normal mood and affect. His speech is normal.  Nursing note and vitals reviewed.    ED Treatments / Results  Labs (all labs ordered are listed, but only abnormal results are displayed) Labs Reviewed  BASIC METABOLIC PANEL - Abnormal; Notable for the following:       Result Value   Potassium 3.0 (*)    Glucose, Bld 122 (*)    All other components within normal limits  CBC - Abnormal; Notable for the following:    HCT 38.3 (*)    All other components within normal limits  D-DIMER, QUANTITATIVE (NOT AT [facility])  Rosezena Sensor, ED  Rosezena Sensor, ED    EKG  EKG Interpretation  Date/Time:  Tuesday  Sep 02 2016 12:58:22 EDT Ventricular Rate:  60 PR Interval:  188 QRS Duration: 96 QT Interval:  410 QTC Calculation: 410 R Axis:   39 Text Interpretation:  Normal sinus rhythm Normal ECG when comapred to prior, no significant changes seen.   No STEMI Confirmed by Theda Belfast ([id number]) on 09/02/2016 5:59:52 PM       Radiology Dg Chest 2 View  Result Date: 09/02/2016 CLINICAL DATA:  Chest pain EXAM: CHEST  2 VIEW COMPARISON:  10/20/2013 FINDINGS: The heart size and mediastinal contours are within normal limits. Both lungs are clear. The visualized skeletal structures are unremarkable. IMPRESSION: No active cardiopulmonary disease. Electronically Signed   By: Marlan Palau M.D.   On: 09/02/2016 14:36    Procedures Procedures (including critical care time)  Medications Ordered in ED Medications  potassium chloride SA (K-DUR,KLOR-CON) CR tablet 40 mEq (not administered)  gi cocktail (Maalox,Lidocaine,Donnatal) (30 mLs Oral Given 09/02/16 1949)     Initial Impression / Assessment and Plan / ED Course  I have reviewed the triage vital signs and the nursing notes.  Pertinent labs & imaging  results that were available during my care of the patient were reviewed by me and considered in my medical decision making (see chart for details).     46 year old male with past medical history of hyperlipidemia, hypertension, diabetes who presents with 4 days of intermittent chest pain and shortness of breath. Patient is afebrile, non-toxic appearing, sitting comfortably on examination table. Consider ACS, , acute infectious etiology, muscle strain. Vital signs reviewed. Patient has had a regular rate and O2 sats have been greater than 95% on RA throughout ED course. Per negative PERC criteria, patient is low risk for a PE. Initial labs ordered at triage including CBC, BMP, troponin, EKG, chest x-ray.  Labs reviewed. CBC within normal limits. BMP has a low potassium of 3.0. Per patient this is a chronic issue as one of his hypertension medications causes him to have low potassium. He is on oral potassium replacement pills at home. BMP otherwise unremarkable. Initial troponin negative. EKG normal sinus rhythm, rate 60. Chest x-ray negative for any acute infectious etiology. Will plan to repeat troponin in the ED.  Patient has a hard score of 5 given suspicious history/physical exam, age and greater than 3 risk factors.  Given history/physical exam and heart score will likely need admission for chest pain rule out.  Repeat troponin emergency department is 0.01. D-dimer still pending. Discussed with patient. He reports he is still having some left-sided chest pain but does not want any pain medication at this time.  D-dimer resulted. Negative. Discussed results with patient. Will call and consult hospitalist for admission.  11:43 PM: Discussed with Dr. Doristine Section. Will admit.    Final Clinical Impressions(s) / ED Diagnoses   Final diagnoses:  Chest pain, unspecified type    New Prescriptions New Prescriptions   No medications on file     Rosana Hoes 09/02/16 2343      Tegeler, Canary Brim, MD 09/03/16 [id number]

## 2016-09-02 NOTE — Telephone Encounter (Signed)
New message   Pt c/o of Chest Pain: STAT if CP now or developed within 24 hours  1. Are you having CP right now? A little bit-worse yesterday  2. Are you experiencing any other symptoms (ex. SOB, nausea, vomiting, sweating)? Some sweating and SOB  3. How long have you been experiencing CP? Since Friday  4. Is your CP continuous or coming and going? Comes and goes  5. Have you taken Nitroglycerin? No   Pt has appt with Dr. Mayford Knifeurner on 5/29 at 11:40.  ?

## 2016-09-02 NOTE — ED Triage Notes (Signed)
Pt reports central and left sided chest tightness/pressure onset Friday associated with headache

## 2016-09-02 NOTE — ED Notes (Signed)
Lab contacted about D dimer results. Main lab states test never received. Mini lab contacted

## 2016-09-02 NOTE — Telephone Encounter (Signed)
Patient reports chest discomfort since Friday.  He states his CP is "a little better today" - 2-3/10 on the pain scale. He reports the discomfort is in the middle left part of his chest and is pretty constant. He describes the discomfort as pressure. He is audibly SOB on the phone and reports sweating. He also states his appetite has been down and he cannot finish his meals, which is odd for him. Instructed patient to report to the ED for evaluation of CP and SOB.  He was grateful for call and agrees with treatment plan.

## 2016-09-03 ENCOUNTER — Observation Stay (HOSPITAL_BASED_OUTPATIENT_CLINIC_OR_DEPARTMENT_OTHER): Payer: BLUE CROSS/BLUE SHIELD

## 2016-09-03 ENCOUNTER — Encounter (HOSPITAL_COMMUNITY): Payer: Self-pay | Admitting: Internal Medicine

## 2016-09-03 ENCOUNTER — Other Ambulatory Visit (HOSPITAL_COMMUNITY): Payer: BLUE CROSS/BLUE SHIELD

## 2016-09-03 DIAGNOSIS — R079 Chest pain, unspecified: Secondary | ICD-10-CM | POA: Diagnosis not present

## 2016-09-03 DIAGNOSIS — I10 Essential (primary) hypertension: Secondary | ICD-10-CM

## 2016-09-03 DIAGNOSIS — R072 Precordial pain: Secondary | ICD-10-CM | POA: Diagnosis not present

## 2016-09-03 DIAGNOSIS — G4733 Obstructive sleep apnea (adult) (pediatric): Secondary | ICD-10-CM

## 2016-09-03 LAB — CBC
HCT: 37.3 % — ABNORMAL LOW (ref 39.0–52.0)
Hemoglobin: 12.9 g/dL — ABNORMAL LOW (ref 13.0–17.0)
MCH: 31.3 pg (ref 26.0–34.0)
MCHC: 34.6 g/dL (ref 30.0–36.0)
MCV: 90.5 fL (ref 78.0–100.0)
PLATELETS: 249 10*3/uL (ref 150–400)
RBC: 4.12 MIL/uL — ABNORMAL LOW (ref 4.22–5.81)
RDW: 13.5 % (ref 11.5–15.5)
WBC: 5.9 10*3/uL (ref 4.0–10.5)

## 2016-09-03 LAB — NM MYOCAR MULTI W/SPECT W/WALL MOTION / EF
CHL CUP RESTING HR STRESS: 64 {beats}/min
CSEPEW: 1 METS
Exercise duration (min): 5 min
Exercise duration (sec): 12 s
Peak HR: 92 {beats}/min

## 2016-09-03 LAB — TROPONIN I: Troponin I: <0.03 ng/mL (ref <0.03)

## 2016-09-03 LAB — ECHOCARDIOGRAM COMPLETE
HEIGHTINCHES: 72 in
WEIGHTICAEL: 4454.4 [oz_av]

## 2016-09-03 LAB — HIV ANTIBODY (ROUTINE TESTING W REFLEX): HIV Screen 4th Generation wRfx: NONREACTIVE

## 2016-09-03 LAB — CREATININE, SERUM
Creatinine, Ser: 0.92 mg/dL (ref 0.61–1.24)
GFR calc non Af Amer: >60 mL/min (ref >60)

## 2016-09-03 LAB — MRSA PCR SCREENING: MRSA by PCR: NEGATIVE

## 2016-09-03 LAB — GLUCOSE, CAPILLARY: GLUCOSE-CAPILLARY: 198 mg/dL — AB (ref 65–99)

## 2016-09-03 MED ORDER — AMLODIPINE BESYLATE 10 MG PO TABS: 10.0000 mg | ORAL_TABLET | Freq: Every day | ORAL | 0 refills | Status: DC

## 2016-09-03 MED ORDER — ACETAMINOPHEN 325 MG PO TABS: 650.0000 mg | ORAL_TABLET | ORAL | Status: DC | PRN

## 2016-09-03 MED ORDER — PANTOPRAZOLE SODIUM 40 MG PO TBEC
40.0000 mg | DELAYED_RELEASE_TABLET | Freq: Every day | ORAL | Status: DC
  Administered 2016-09-03: 40 mg via ORAL
  Filled 2016-09-03: qty 1

## 2016-09-03 MED ORDER — VALSARTAN-HYDROCHLOROTHIAZIDE 320-25 MG PO TABS: 1.0000 | ORAL_TABLET | Freq: Every day | ORAL | Status: DC

## 2016-09-03 MED ORDER — REGADENOSON 0.4 MG/5ML IV SOLN
0.4000 mg | Freq: Once | INTRAVENOUS | Status: AC
  Administered 2016-09-03: 0.4 mg via INTRAVENOUS
  Filled 2016-09-03: qty 5

## 2016-09-03 MED ORDER — DILTIAZEM HCL ER COATED BEADS 240 MG PO CP24: 240.0000 mg | ORAL_CAPSULE | Freq: Every morning | ORAL | Status: DC

## 2016-09-03 MED ORDER — ENOXAPARIN SODIUM 40 MG/0.4ML ~~LOC~~ SOLN
40.0000 mg | SUBCUTANEOUS | Status: DC
  Administered 2016-09-03: 40 mg via SUBCUTANEOUS
  Filled 2016-09-03: qty 0.4

## 2016-09-03 MED ORDER — POTASSIUM CHLORIDE CRYS ER 20 MEQ PO TBCR
40.0000 meq | EXTENDED_RELEASE_TABLET | Freq: Every day | ORAL | Status: DC
  Administered 2016-09-03: 40 meq via ORAL
  Filled 2016-09-03: qty 2

## 2016-09-03 MED ORDER — ADULT MULTIVITAMIN W/MINERALS CH
1.0000 | ORAL_TABLET | Freq: Every day | ORAL | Status: DC
  Administered 2016-09-03: 1 via ORAL
  Filled 2016-09-03: qty 1

## 2016-09-03 MED ORDER — METOPROLOL SUCCINATE ER 50 MG PO TB24
50.0000 mg | ORAL_TABLET | Freq: Every day | ORAL | Status: DC
  Administered 2016-09-03: 50 mg via ORAL
  Filled 2016-09-03: qty 1

## 2016-09-03 MED ORDER — TECHNETIUM TC 99M TETROFOSMIN IV KIT
30.0000 | PACK | Freq: Once | INTRAVENOUS | Status: AC | PRN
  Administered 2016-09-03: 30 via INTRAVENOUS

## 2016-09-03 MED ORDER — ONDANSETRON HCL 4 MG/2ML IJ SOLN: 4.0000 mg | Freq: Four times a day (QID) | INTRAMUSCULAR | Status: DC | PRN

## 2016-09-03 MED ORDER — PANTOPRAZOLE SODIUM 40 MG PO TBEC: 40.0000 mg | DELAYED_RELEASE_TABLET | Freq: Every day | ORAL | 0 refills | Status: DC

## 2016-09-03 MED ORDER — ASPIRIN EC 81 MG PO TBEC
81.0000 mg | DELAYED_RELEASE_TABLET | Freq: Every morning | ORAL | Status: DC
Start: 2016-09-03 — End: 2016-09-03
  Administered 2016-09-03: 81 mg via ORAL
  Filled 2016-09-03: qty 1

## 2016-09-03 MED ORDER — ATORVASTATIN CALCIUM 40 MG PO TABS
40.0000 mg | ORAL_TABLET | Freq: Every day | ORAL | Status: DC
  Administered 2016-09-03: 40 mg via ORAL
  Filled 2016-09-03: qty 1

## 2016-09-03 MED ORDER — DOXAZOSIN MESYLATE 4 MG PO TABS
4.0000 mg | ORAL_TABLET | Freq: Every day | ORAL | Status: DC
  Administered 2016-09-03: 4 mg via ORAL
  Filled 2016-09-03: qty 1

## 2016-09-03 MED ORDER — NITROGLYCERIN 0.4 MG SL SUBL: 0.4000 mg | SUBLINGUAL_TABLET | SUBLINGUAL | Status: DC | PRN

## 2016-09-03 MED ORDER — HYDROCHLOROTHIAZIDE 25 MG PO TABS
25.0000 mg | ORAL_TABLET | Freq: Every day | ORAL | Status: DC
  Administered 2016-09-03: 25 mg via ORAL
  Filled 2016-09-03: qty 1

## 2016-09-03 MED ORDER — TECHNETIUM TC 99M TETROFOSMIN IV KIT
10.0000 | PACK | Freq: Once | INTRAVENOUS | Status: AC | PRN
  Administered 2016-09-03: 10 via INTRAVENOUS

## 2016-09-03 MED ORDER — IRBESARTAN 150 MG PO TABS
300.0000 mg | ORAL_TABLET | Freq: Every day | ORAL | Status: DC
  Administered 2016-09-03: 300 mg via ORAL
  Filled 2016-09-03: qty 2

## 2016-09-03 MED ORDER — REGADENOSON 0.4 MG/5ML IV SOLN
INTRAVENOUS | Status: AC
  Administered 2016-09-03: 0.4 mg via INTRAVENOUS
  Filled 2016-09-03: qty 5

## 2016-09-03 MED ORDER — AMLODIPINE BESYLATE 10 MG PO TABS
10.0000 mg | ORAL_TABLET | Freq: Every day | ORAL | Status: DC
Start: 2016-09-03 — End: 2016-09-03
  Administered 2016-09-03: 10 mg via ORAL
  Filled 2016-09-03: qty 1

## 2016-09-03 NOTE — Discharge Summary (Signed)
Physician Discharge Summary  Rodney Dominguez ZOX:[id number] DOB: [birth year] DOA: 09/02/2016  PCP: Blair Heys, MD  Admit date: 09/02/2016 Discharge date: 09/03/2016   Recommendations for Outpatient Follow-Up:   Outpatient cardiology follow up  Discharge Diagnosis:   Principal Problem:   Chest pain Active Problems:   Obstructive sleep apnea   Essential hypertension, benign   Discharge disposition:  Home  Discharge Condition: Improved.  Diet recommendation: Low sodium, heart healthy  Wound care: None.   History of Present Illness:   Rodney Dominguez is a 46 y.o. male with history of hypertension, hyperlipidemia, sleep apnea presents to the ER with complaints of chest pain. Patient has been having chest pain for last 4 days which is retrosternal nonradiating increase on exertion with mild shortness of breath. Pain initially was burning in sensation and patient thought it would be hard bone. Since it became more persistent and now it is more pressure-like patient came to the ER. Denies any productive cough fever or chills.    Hospital Course by Problem:   Chest pain -echo and ST normal -added PPI--- may need outpatient GI referral if not improved  Hypertension -cards switched Cardizem to amlodipine  Hyperlipidemia -Continue statin  Hypokalemia -repleted  Obstructive sleep apnea -Continue to use CPAP at at bedtime (Dr. Mayford Knife manages)    Medical Consultants:    cards   Discharge Exam:   Vitals:   09/03/16 1304 09/03/16 1635  BP:  131/87  Pulse: 79 92  Resp:  (!) 21  Temp:  98.5 F (36.9 C)   Vitals:   09/03/16 1301 09/03/16 1303 09/03/16 1304 09/03/16 1635  BP: (!) 144/103 (!) 145/108  131/87  Pulse: 81 67 79 92  Resp:    (!) 21  Temp:    98.5 F (36.9 C)  TempSrc:    Oral  SpO2:    95%  Weight:      Height:        Gen:  NAD    The results of significant diagnostics from this hospitalization (including imaging, microbiology,  ancillary and laboratory) are listed below for reference.     Procedures and Diagnostic Studies:   Dg Chest 2 View  Result Date: 09/02/2016 CLINICAL DATA:  Chest pain EXAM: CHEST  2 VIEW COMPARISON:  10/20/2013 FINDINGS: The heart size and mediastinal contours are within normal limits. Both lungs are clear. The visualized skeletal structures are unremarkable. IMPRESSION: No active cardiopulmonary disease. Electronically Signed   By: Marlan Palau M.D.   On: 09/02/2016 14:36   Nm Myocar Multi W/spect W/wall Motion / Ef  Result Date: 09/03/2016 CLINICAL DATA:  46 year old with chest pain.  Sleep apnea. EXAM: MYOCARDIAL IMAGING WITH SPECT (REST AND PHARMACOLOGIC-STRESS) GATED LEFT VENTRICULAR WALL MOTION STUDY LEFT VENTRICULAR EJECTION FRACTION TECHNIQUE: Standard myocardial SPECT imaging was performed after resting intravenous injection of 10 mCi Tc-61m tetrofosmin. Subsequently, intravenous infusion of Lexiscan was performed under the supervision of the Cardiology staff. At peak effect of the drug, 30 mCi Tc-59m tetrofosmin was injected intravenously and standard myocardial SPECT imaging was performed. Quantitative gated imaging was also performed to evaluate left ventricular wall motion, and estimate left ventricular ejection fraction. COMPARISON:  None. FINDINGS: Perfusion: No decreased activity in the left ventricle on stress imaging to suggest reversible ischemia or infarction. Wall Motion: Normal left ventricular wall motion. No left ventricular dilation. Left Ventricular Ejection Fraction: 55 % End diastolic volume 121 ml End systolic volume 54 ml IMPRESSION: 1. No reversible ischemia or infarction. 2. Normal left  ventricular wall motion. 3. Left ventricular ejection fraction is 55%. 4. Non invasive risk stratification*: Low *2012 Appropriate Use Criteria for Coronary Revascularization Focused Update: J Am Coll Cardiol. 2012;59(9):857-881. http://content.dementiazones.comonlinejacc.org/article.aspx?articleid=1201161  Electronically Signed   By: Richarda OverlieAdam  Henn M.D.   On: 09/03/2016 17:58     Labs:   Basic Metabolic Panel:  Recent Labs Lab 09/02/16 1303 09/03/16 0107  NA 139  --   K 3.0*  --   CL 104  --   CO2 25  --   GLUCOSE 122*  --   BUN 12  --   CREATININE 0.86 0.92  CALCIUM 9.5  --    GFR Estimated Creatinine Clearance: 139.3 mL/min (by C-G formula based on SCr of 0.92 mg/dL). Liver Function Tests: No results for input(s): AST, ALT, ALKPHOS, BILITOT, PROT, ALBUMIN in the last 168 hours. No results for input(s): LIPASE, AMYLASE in the last 168 hours. No results for input(s): AMMONIA in the last 168 hours. Coagulation profile No results for input(s): INR, PROTIME in the last 168 hours.  CBC:  Recent Labs Lab 09/02/16 1303 09/03/16 0107  WBC 5.5 5.9  HGB 13.4 12.9*  HCT 38.3* 37.3*  MCV 90.1 90.5  PLT 263 249   Cardiac Enzymes:  Recent Labs Lab 09/03/16 0107 09/03/16 0607 09/03/16 1423  TROPONINI <0.03 <0.03 <0.03   BNP: Invalid input(s): POCBNP CBG:  Recent Labs Lab 09/03/16 0126  GLUCAP 198*   D-Dimer  Recent Labs  09/02/16 1953  DDIMER <0.27   Hgb A1c No results for input(s): HGBA1C in the last 72 hours. Lipid Profile No results for input(s): CHOL, HDL, LDLCALC, TRIG, CHOLHDL, LDLDIRECT in the last 72 hours. Thyroid function studies No results for input(s): TSH, T4TOTAL, T3FREE, THYROIDAB in the last 72 hours.  Invalid input(s): FREET3 Anemia work up No results for input(s): VITAMINB12, FOLATE, FERRITIN, TIBC, IRON, RETICCTPCT in the last 72 hours. Microbiology Recent Results (from the past 240 hour(s))  MRSA PCR Screening     Status: None   Collection Time: 09/03/16  1:10 AM  Result Value Ref Range Status   MRSA by PCR NEGATIVE NEGATIVE Final    Comment:        The GeneXpert MRSA Assay (FDA approved for NASAL specimens only), is one component of a comprehensive MRSA colonization surveillance program. It is not intended to diagnose  MRSA infection nor to guide or monitor treatment for MRSA infections.      Discharge Instructions:   Discharge Instructions    Diet - low sodium heart healthy    Complete by:  As directed    Discharge instructions    Complete by:  As directed    GERD diet   Increase activity slowly    Complete by:  As directed      Allergies as of 09/03/2016      Reactions   Bee Venom Anaphylaxis   Ace Inhibitors Cough      Medication List    STOP taking these medications   diltiazem 240 MG 24 hr capsule Commonly known as:  CARDIZEM CD     TAKE these medications   amLODipine 10 MG tablet Commonly known as:  NORVASC Take 1 tablet (10 mg total) by mouth daily. Start taking on:  09/04/2016   aspirin EC 81 MG tablet Take 1 tablet (81 mg total) by mouth every morning.   atorvastatin 40 MG tablet Commonly known as:  LIPITOR Take 40 mg by mouth daily.   doxazosin 4 MG tablet Commonly known  as:  CARDURA Take 4 mg by mouth daily.   EPIPEN 2-PAK 0.3 mg/0.3 mL Soaj injection Generic drug:  EPINEPHrine Inject 0.3 mg into the skin as needed (Allergic reaction).   metoprolol succinate 50 MG 24 hr tablet Commonly known as:  TOPROL-XL Take 50 mg by mouth daily. Take with or immediately following a meal.   multivitamin with minerals tablet Take 1 tablet by mouth daily.   pantoprazole 40 MG tablet Commonly known as:  PROTONIX Take 1 tablet (40 mg total) by mouth daily.   potassium chloride SA 20 MEQ tablet Commonly known as:  K-DUR,KLOR-CON Take 40 mEq by mouth daily.   valsartan-hydrochlorothiazide 320-25 MG tablet Commonly known as:  DIOVAN-HCT Take 1 tablet by mouth daily.      Follow-up Information    Blair Heys, MD Follow up in 1 week(s).   Specialty:  Family Medicine Contact information: [address] [place] [address] [phone]        Quintella Reichert, MD Follow up.   Specialty:  Cardiology Why:  keep appointment with Dr. Mayford Knife as  previously scheduled Contact information: [address] [place] [address] [phone]            Time coordinating discharge: 25 min  Signed:  Joanathan Dominguez Juanetta Gosling   [facility] 09/03/2016, 6:05 PM

## 2016-09-03 NOTE — Progress Notes (Signed)
  Echocardiogram 2D Echocardiogram has been performed.  Janalyn HarderWest, Loghan Kurtzman R 09/03/2016, 6:20 PM

## 2016-09-03 NOTE — Progress Notes (Signed)
Echo showed normal EF. Stress test is low risk. No further testing. Ok to discharge on cardiac stand point.

## 2016-09-03 NOTE — Progress Notes (Signed)
Patient admitted after midnight.  Await echo and stress test results.  PPI added Marlin CanaryJessica Vann DO

## 2016-09-03 NOTE — Consult Note (Signed)
Cardiology Consultation:   Patient ID: Rodney Dominguez; [id number]; 1970-08-18   Admit date: 09/02/2016 Date of Consult: 09/03/2016  Primary Care Provider: Blair Heys, MD Primary Cardiologist: New   Patient Profile:   Rodney Dominguez is a 46 y.o. male with a hx of Hypertension, hyperlidmemia, diet controlled diabetes and sleep apnea on CPAP who is being seen today for the evaluation of Chest pain at the request of Dr. Benjamine Mola.  History of Present Illness:   Rodney Dominguez on Friday began To experience some central chest fullness and dull burning that eventually turned into a pressure/tightness. This was a constant low level with intermittent more intense tightness with shortness of breath and occurring with exertion like walking up stairs or walking the length of his building at work. The more intense tightness did resolve with rest. He initially thought this was heartburn but appears persisted and was more clearly associated with activity. He usually is fairly sedentary except for walking 2 blocks from his car to his office. This walk usually does not bother him however lately he has been having to rest since he reaches building. Prior to this episode he had no exertional symptoms. He denies orthopnea, edema, nausea. He did note a few episodes of feeling his heart beat more distinctly yesterday. He also has occasional lightheadedness and diaphoresis with these episodes.  He has a history of hypertension, hyperlipidemia, diet controlled diabetes and sleep apnea treated with CPAP which he is compliant with. He has no previous cardiac history or cardiac testing. He does not smoke or drink alcohol currently. He used to smoke an occasional cigar and have an occasional drink years ago. His mother has a history of diabetes and hypertension his father he thinks has an enlarged heart and hypertension. He has a brother who is healthy.  Cardiac enzymes have been negative for 3 occurrences. D-dimer was  normal. He was hypokalemic on presentation with potassium of 3.0. Kidney function is normal. He is not anemic. EKG shows sinus rhythm at 60 bpm with no acute ischemic changes. Chest x-ray shows no active cardiopulmonary disease.   Past Medical History:  Diagnosis Date  . Diabetes mellitus without complication (HCC)    type II  . Hypercholesteremia   . Hypertension   . Kidney stone    left -first ever; surgery planned  . Obesity   . Sleep apnea    cpap- settings 11    Past Surgical History:  Procedure Laterality Date  . CYSTOSCOPY W/ RETROGRADES Left 10/28/2013   Procedure: CYSTOSCOPY WITH RETROGRADE PYELOGRAM LEFT URETEROSCOPY AND STENT PLACEMENT;  Surgeon: Sebastian Ache, MD;  Location: [facility];  Service: Urology;  Laterality: Left;  . NEPHROLITHOTOMY Left 10/28/2013   Procedure: NEPHROLITHOTOMY PERCUTANEOUS WITH ACCESS;  Surgeon: Sebastian Ache, MD;  Location: [facility];  Service: Urology;  Laterality: Left;  . WISDOM TOOTH EXTRACTION       Inpatient Medications: Scheduled Meds: . amLODipine  10 mg Oral Daily  . aspirin EC  81 mg Oral q morning - 10a  . atorvastatin  40 mg Oral q1800  . doxazosin  4 mg Oral Daily  . enoxaparin (LOVENOX) injection  40 mg Subcutaneous Q24H  . irbesartan  300 mg Oral Daily   And  . hydrochlorothiazide  25 mg Oral Daily  . metoprolol succinate  50 mg Oral Daily  . multivitamin with minerals  1 tablet Oral Daily  . potassium chloride SA  40 mEq Oral Daily   Continuous Infusions:  PRN Meds: acetaminophen, nitroGLYCERIN,  ondansetron (ZOFRAN) IV  Allergies:    Allergies  Allergen Reactions  . Bee Venom Anaphylaxis  . Ace Inhibitors Cough    Social History:   Social History   Social History  . Marital status: Married    Spouse name: Rodney Dominguez  . Number of children: Rodney Dominguez  . Years of education: Rodney Dominguez   Occupational History  . Not on file.   Social History Main Topics  . Smoking status: Former Smoker    Types: Pipe, Cigars    Quit date:  10/21/2003  . Smokeless tobacco: Never Used     Comment: ocassionally smokes a cigar  . Alcohol use No     Comment: Heavy durin g college years, Quit '99  . Drug use: No  . Sexual activity: Yes   Other Topics Concern  . Not on file   Social History Narrative  . No narrative on file    Family History:   The patient's family history includes Cancer in his mother; Diabetes in his mother; Hyperlipidemia in his father and mother; Hypertension in his father and mother.  ROS:  Please see the history of present illness.  All other ROS reviewed and negative.     Physical Exam/Data:   Vitals:   09/03/16 0057 09/03/16 0125 09/03/16 0447 09/03/16 0839  BP: 140/90  127/90 (!) 146/95  Pulse: (!) 53  (!) 50 (!) 58  Resp:  17 15 18   Temp: 97.7 F (36.5 C)  97.5 F (36.4 C) 98.4 F (36.9 C)  TempSrc: Oral  Oral Oral  SpO2: 98%  95% 96%  Weight: 278 lb 6.4 oz (126.3 kg)     Height: 6' (1.829 m)      No intake or output data in the 24 hours ending 09/03/16 0906 Filed Weights   09/03/16 0057  Weight: 278 lb 6.4 oz (126.3 kg)   Body mass index is 37.76 kg/m.  General:  Well nourished, well developed, in no acute distress HEENT: normal Lymph: no adenopathy Neck: no JVD Endocrine:  No thryomegaly Vascular: No carotid bruits; FA pulses 2+ bilaterally without bruits  Cardiac:  normal S1, S2; RRR; no murmur  Lungs:  clear to auscultation bilaterally, no wheezing, rhonchi or rales  Abd: soft, nontender, no hepatomegaly  Ext: Trace pretibial edema Musculoskeletal:  No deformities, BUE and BLE strength normal and equal Skin: warm and dry  Neuro:  CNs 2-12 intact, no focal abnormalities noted Psych:  Normal affect    EKG:  The EKG was personally reviewed and demonstrates Normal sinus rhythm at 60 bpm with no acute ischemic changes  Relevant CV Studies: None previously. Echocardiogram pending  Laboratory Data:  Chemistry  Recent Labs Lab 09/02/16 1303 09/03/16 0107  NA 139  --    K 3.0*  --   CL 104  --   CO2 25  --   GLUCOSE 122*  --   BUN 12  --   CREATININE 0.86 0.92  CALCIUM 9.5  --   GFRNONAA >60 >60  GFRAA >60 >60  ANIONGAP 10  --     No results for input(s): PROT, ALBUMIN, AST, ALT, ALKPHOS, BILITOT in the last 168 hours. Hematology  Recent Labs Lab 09/02/16 1303 09/03/16 0107  WBC 5.5 5.9  RBC 4.25 4.12*  HGB 13.4 12.9*  HCT 38.3* 37.3*  MCV 90.1 90.5  MCH 31.5 31.3  MCHC 35.0 34.6  RDW 13.4 13.5  PLT 263 249   Cardiac Enzymes  Recent Labs Lab 09/03/16 0107 09/03/16  1610  TROPONINI <0.03 <0.03     Recent Labs Lab 09/02/16 1319 09/02/16 2004  TROPIPOC 0.00 0.01    BNPNo results for input(s): BNP, PROBNP in the last 168 hours.  DDimer   Recent Labs Lab 09/02/16 1953  DDIMER <0.27    Radiology/Studies:  Dg Chest 2 View  Result Date: 09/02/2016 CLINICAL DATA:  Chest pain EXAM: CHEST  2 VIEW COMPARISON:  10/20/2013 FINDINGS: The heart size and mediastinal contours are within normal limits. Both lungs are clear. The visualized skeletal structures are unremarkable. IMPRESSION: No active cardiopulmonary disease. Electronically Signed   By: Marlan Palau M.D.   On: 09/02/2016 14:36    Assessment and Plan:   Chest pain -Patient has fairly typical exertional chest pain and dyspnea which is improved with rest, however, he has ongoing constant mild discomfort that is atypical -Cardiac enzymes have been negative 3. EKG is without acute ischemic changes. Chest x-ray is unremarkable. D-dimer was normal -CVD risk factors include obesity, hypertension, hyperlipidemia, sleep apnea -Further cardiac workup is indicated. Will order Lexiscan myoview to assess myocardial perfusion. If negative, pursue other causes of his symptoms.  Hypertension -Managed with Cardizem 240 mg daily, Toprol-XL 50 mg daily, doxazosin 4 mg daily and valsartan-hydrochlorothiazide 320-25 milligrams daily -Blood pressure is mildly elevated. Heart rate is in the  50's-60's. Will switch Cardizem to amlodipine  Hyperlipidemia -Continue statin, managed by PCP  Hypokalemia -Potassium 3.0 on presentation, supplementation has been ordered  Obstructive sleep apnea -Continue to use CPAP at at bedtime  Signed, Berton Bon, NP  09/03/2016 9:06 AM

## 2016-09-03 NOTE — Progress Notes (Signed)
Patient presented for Lexiscan. Tolerated procedure well. Pending final stress imaging result.  

## 2016-09-03 NOTE — H&P (Signed)
History and Physical    [name] ZOX:[id number] DOB: [birth year] DOA: 09/02/2016  PCP: Blair Heys, MD  Patient coming from: Home.  Chief Complaint: Chest pain.  HPI: Rodney Dominguez is a 46 y.o. male with history of hypertension, hyperlipidemia, sleep apnea presents to the ER with complaints of chest pain. Patient has been having chest pain for last 4 days which is retrosternal nonradiating increase on exertion with mild shortness of breath. Pain initially was burning in sensation and patient thought it would be hard bone. Since it became more persistent and now it is more pressure-like patient came to the ER. Denies any productive cough fever or chills.   ED Course: In the ER patient's troponin was negative EKG was showing normal sinus rhythm and d-dimer was negative. Chest x-ray was unremarkable. Patient still has persistent chest pain and will try nitroglycerin sublingual. Patient is being admitted for further observation and management of chest pain.  Review of Systems: As per HPI, rest all negative.   Past Medical History:  Diagnosis Date  . Diabetes mellitus without complication (HCC)    type II  . Hypercholesteremia   . Hypertension   . Kidney stone    left -first ever; surgery planned  . Obesity   . Sleep apnea    cpap- settings 11    Past Surgical History:  Procedure Laterality Date  . CYSTOSCOPY W/ RETROGRADES Left 10/28/2013   Procedure: CYSTOSCOPY WITH RETROGRADE PYELOGRAM LEFT URETEROSCOPY AND STENT PLACEMENT;  Surgeon: Sebastian Ache, MD;  Location: [facility];  Service: Urology;  Laterality: Left;  . NEPHROLITHOTOMY Left 10/28/2013   Procedure: NEPHROLITHOTOMY PERCUTANEOUS WITH ACCESS;  Surgeon: Sebastian Ache, MD;  Location: [facility];  Service: Urology;  Laterality: Left;  . WISDOM TOOTH EXTRACTION       reports that he quit smoking about 12 years ago. His smoking use included Pipe and Cigars. He has never used smokeless tobacco. He reports that he does not  drink alcohol or use drugs.  Allergies  Allergen Reactions  . Bee Venom Anaphylaxis  . Ace Inhibitors Cough    Family History  Problem Relation Age of Onset  . Hypertension Mother   . Hyperlipidemia Mother   . Cancer Mother   . Diabetes Mother   . Hypertension Father   . Hyperlipidemia Father     Prior to Admission medications   Medication Sig Start Date End Date Taking? Authorizing Provider  aspirin EC 81 MG tablet Take 1 tablet (81 mg total) by mouth every morning. 09/08/15  Yes Jerilee Field, MD  atorvastatin (LIPITOR) 40 MG tablet Take 40 mg by mouth daily. 09/12/14  Yes [provider]  diltiazem (CARDIZEM CD) 240 MG 24 hr capsule Take 240 mg by mouth every morning.   Yes [provider]  doxazosin (CARDURA) 4 MG tablet Take 4 mg by mouth daily.   Yes [provider]  EPIPEN 2-PAK 0.3 MG/0.3ML SOAJ Inject 0.3 mg into the skin as needed (Allergic reaction).  08/02/12  Yes [provider]  metoprolol succinate (TOPROL-XL) 50 MG 24 hr tablet Take 50 mg by mouth daily. Take with or immediately following a meal.   Yes [provider]  Multiple Vitamins-Minerals (MULTIVITAMIN WITH MINERALS) tablet Take 1 tablet by mouth daily.   Yes [provider]  potassium chloride SA (K-DUR,KLOR-CON) 20 MEQ tablet Take 40 mEq by mouth daily.   Yes [provider]  valsartan-hydrochlorothiazide (DIOVAN-HCT) 320-25 MG tablet Take 1 tablet by mouth daily.   Yes  [provider]    Physical Exam: Vitals:   09/02/16 2130 09/02/16 2200 09/02/16 2230 09/02/16 2330  BP: (!) 153/95 (!) 143/96 (!) 145/97 (!) 142/99  Pulse: (!) 54 (!) 48 (!) 50 (!) 47  Resp: (!) 22 (!) 21 18 17   Temp:      TempSrc:      SpO2: 97% 96% 96% 96%      Constitutional: Moderately built and nourished. Vitals:   09/02/16 2130 09/02/16 2200 09/02/16 2230 09/02/16 2330  BP: (!) 153/95 (!) 143/96 (!) 145/97 (!) 142/99  Pulse: (!) 54 (!) 48 (!) 50 (!)  47  Resp: (!) 22 (!) 21 18 17   Temp:      TempSrc:      SpO2: 97% 96% 96% 96%   Eyes: Anicteric no pallor. ENMT: No discharge from the ears eyes nose and mouth. Neck: No mass felt. No JVD appreciated. Respiratory: No rhonchi or crepitations. Cardiovascular: S1-S2 heard no murmurs appreciated. Abdomen: Soft nontender bowel sounds present. No guarding or rigidity. Musculoskeletal: No edema. No joint effusion. Skin: No rash. Skin appears warm. Neurologic: Alert awake oriented to time place and person. Moves all extremities. Psychiatric: Appears normal. Normal affect.   Labs on Admission: I have personally reviewed following labs and imaging studies  CBC:  Recent Labs Lab 09/02/16 1303  WBC 5.5  HGB 13.4  HCT 38.3*  MCV 90.1  PLT 263   Basic Metabolic Panel:  Recent Labs Lab 09/02/16 1303  NA 139  K 3.0*  CL 104  CO2 25  GLUCOSE 122*  BUN 12  CREATININE 0.86  CALCIUM 9.5   GFR: CrCl cannot be calculated (Unknown ideal weight.). Liver Function Tests: No results for input(s): AST, ALT, ALKPHOS, BILITOT, PROT, ALBUMIN in the last 168 hours. No results for input(s): LIPASE, AMYLASE in the last 168 hours. No results for input(s): AMMONIA in the last 168 hours. Coagulation Profile: No results for input(s): INR, PROTIME in the last 168 hours. Cardiac Enzymes: No results for input(s): CKTOTAL, CKMB, CKMBINDEX, TROPONINI in the last 168 hours. BNP (last 3 results) No results for input(s): PROBNP in the last 8760 hours. HbA1C: No results for input(s): HGBA1C in the last 72 hours. CBG: No results for input(s): GLUCAP in the last 168 hours. Lipid Profile: No results for input(s): CHOL, HDL, LDLCALC, TRIG, CHOLHDL, LDLDIRECT in the last 72 hours. Thyroid Function Tests: No results for input(s): TSH, T4TOTAL, FREET4, T3FREE, THYROIDAB in the last 72 hours. Anemia Panel: No results for input(s): VITAMINB12, FOLATE, FERRITIN, TIBC, IRON, RETICCTPCT in the last 72  hours. Urine analysis:    Component Value Date/Time   COLORURINE YELLOW 10/04/2013 0630   APPEARANCEUR CLEAR 10/04/2013 0630   LABSPEC 1.013 10/04/2013 0630   PHURINE 6.5 10/04/2013 0630   GLUCOSEU NEGATIVE 10/04/2013 0630   HGBUR TRACE (A) 10/04/2013 0630   BILIRUBINUR NEGATIVE 10/04/2013 0630   KETONESUR NEGATIVE 10/04/2013 0630   PROTEINUR NEGATIVE 10/04/2013 0630   UROBILINOGEN 1.0 10/04/2013 0630   NITRITE NEGATIVE 10/04/2013 0630   LEUKOCYTESUR TRACE (A) 10/04/2013 0630   Sepsis Labs: @LABRCNTIP (procalcitonin:4,lacticidven:4) )No results found for this or any previous visit (from the past 240 hour(s)).   Radiological Exams on Admission: Dg Chest 2 View  Result Date: 09/02/2016 CLINICAL DATA:  Chest pain EXAM: CHEST  2 VIEW COMPARISON:  10/20/2013 FINDINGS: The heart size and mediastinal contours are within normal limits. Both lungs are clear. The visualized skeletal structures are unremarkable. IMPRESSION: No active cardiopulmonary disease. Electronically Signed  By: Marlan Palau M.D.   On: 09/02/2016 14:36    EKG: Independently reviewed. Normal sinus rhythm.  Assessment/Plan Principal Problem:   Chest pain Active Problems:   Obstructive sleep apnea   Essential hypertension, benign    1. Chest pain - given the history of hypertension hyperlipidemia we'll cycle cardiac markers to rule out ACS. Check 2-D echo. Patient is on aspirin. Sublingual nitroglycerin when necessary. Will keep patient nothing by mouth in a.m. in anticipation of cardiac procedure. Consult cardiologist in a.m. 2. Hypertension on ARB hydrochlorothiazide Cardizem metoprolol. Follow blood pressure trends. 3. Hyperlipidemia on statins. 4. History of sleep apnea on CPAP.   DVT prophylaxis: Lovenox. Code Status: Full code.  Family Communication: Patient's wife.  Disposition Plan: Home.  Consults called: None.  Admission status: Observation.    Eduard Clos MD [facility] Pager  [phone].  If 7PM-7AM, please contact night-coverage [email or web address] Password [facility]  09/03/2016, 12:55 AM

## 2016-09-03 NOTE — ED Notes (Signed)
Attempted report x1. 

## 2016-09-08 NOTE — Progress Notes (Deleted)
Cardiology Office Note    Date:  09/08/2016   ID:  Rodney RutterGregory Dominguez, DOB 1971-02-28, MRN [id number]  PCP:  Blair HeysEhinger, Robert, MD  Cardiologist:  Armanda Magicraci Ronique Simerly, MD   Chief Complaint  Patient presents with  . Sleep Apnea  . Hypertension    History of Present Illness:  Rodney Dominguez is a 46 y.o. male with a history of OSA, HTN and obesity.  He presents today for followup and is doing well. He tolerates his CPAP device with no problems. He continues to feel rested in the am and has minimal daytime sleepiness when using his CPAP. He tolerates the nasal pillow mask and feels the pressure is adequate. He denies any significant nasal or mouth congestionand no signicant nasal congestion.   Past Medical History:  Diagnosis Date  . Diabetes mellitus without complication (HCC)    type II  . Hypercholesteremia   . Hypertension   . Kidney stone    left -first ever; surgery planned  . Obesity   . Sleep apnea    cpap- settings 11    Past Surgical History:  Procedure Laterality Date  . CYSTOSCOPY W/ RETROGRADES Left 10/28/2013   Procedure: CYSTOSCOPY WITH RETROGRADE PYELOGRAM LEFT URETEROSCOPY AND STENT PLACEMENT;  Surgeon: Sebastian Acheheodore Manny, MD;  Location: [facility];  Service: Urology;  Laterality: Left;  . NEPHROLITHOTOMY Left 10/28/2013   Procedure: NEPHROLITHOTOMY PERCUTANEOUS WITH ACCESS;  Surgeon: Sebastian Acheheodore Manny, MD;  Location: [facility];  Service: Urology;  Laterality: Left;  . WISDOM TOOTH EXTRACTION      Current Medications: No outpatient prescriptions have been marked as taking for the 09/09/16 encounter (Office Visit) with Quintella Reicherturner, Nhung Danko R, MD.    Allergies:   Bee venom and Ace inhibitors   Social History   Social History  . Marital status: Married    Spouse name: N/A  . Number of children: N/A  . Years of education: N/A   Social History Main Topics  . Smoking status: Former Smoker    Types: Pipe, Cigars    Quit date: 10/21/2003  . Smokeless tobacco: Never Used     Comment:  ocassionally smokes a cigar  . Alcohol use No     Comment: Heavy durin g college years, Quit '99  . Drug use: No  . Sexual activity: Yes   Other Topics Concern  . Not on file   Social History Narrative  . No narrative on file     Family History:  The patient's family history includes Cancer in his mother; Diabetes in his mother; Hyperlipidemia in his father and mother; Hypertension in his father and mother.   ROS:   Please see the history of present illness.    ROS All other systems reviewed and are negative.  No flowsheet data found.     PHYSICAL EXAM:   VS:  There were no vitals taken for this visit.   GEN: Well nourished, well developed, in no acute distress  HEENT: normal  Neck: no JVD, carotid bruits, or masses Cardiac: RRR; no murmurs, rubs, or gallops,no edema.  Intact distal pulses bilaterally.  Respiratory:  clear to auscultation bilaterally, normal work of breathing GI: soft, nontender, nondistended, + BS MS: no deformity or atrophy  Skin: warm and dry, no rash Neuro:  Alert and Oriented x 3, Strength and sensation are intact Psych: euthymic mood, full affect  Wt Readings from Last 3 Encounters:  09/03/16 278 lb 6.4 oz (126.3 kg)  09/06/15 273 lb 6.4 oz (124 kg)  09/15/14 267  lb 1.9 oz (121.2 kg)      Studies/Labs Reviewed:   EKG:  EKG is not ordered today.    Recent Labs: 09/02/2016: BUN 12; Potassium 3.0; Sodium 139 09/03/2016: Creatinine, Ser 0.92; Hemoglobin 12.9; Platelets 249   Lipid Panel No results found for: CHOL, TRIG, HDL, CHOLHDL, VLDL, LDLCALC, LDLDIRECT  Additional studies/ records that were reviewed today include:  CPAP download    ASSESSMENT:    1. Obstructive sleep apnea   2. Essential hypertension, benign   3. Class 2 severe obesity due to excess calories with serious comorbidity and body mass index (BMI) of 35.0 to 35.9 in adult [facility])      PLAN:  In order of problems listed above:  OSA - the patient is tolerating PAP  therapy well without any problems. The PAP download was reviewed today and showed an AHI of ***/hr on *** cm H2O with ***% compliance in using more than 4 hours nightly.  The patient has been using and benefiting from CPAP use and will continue to benefit from therapy.  HTN - His BP is adequately controlled on exam today.   Obesity - I have encouraged him to get into a routine exercise program and cut back on carbs and portions.      Medication Adjustments/Labs and Tests Ordered: Current medicines are reviewed at length with the patient today.  Concerns regarding medicines are outlined above.  Medication changes, Labs and Tests ordered today are listed in the Patient Instructions below.  There are no Patient Instructions on file for this visit.   Signed, Armanda Magic, MD  09/08/2016 7:55 PM    [facility] [address] [place], [place], [place]  [address] Phone: [phone]; Fax: [phone]

## 2016-09-09 ENCOUNTER — Encounter: Payer: BLUE CROSS/BLUE SHIELD | Admitting: Cardiology

## 2016-09-10 NOTE — Progress Notes (Signed)
This encounter was created in error - please disregard.

## 2016-09-12 ENCOUNTER — Telehealth: Payer: Self-pay | Admitting: *Deleted

## 2016-09-12 NOTE — Telephone Encounter (Addendum)
Called [facility] [place] to get a download on patients cpap and was informed patient'account has not been updated with [facility] since 09/14/2014.  Called patient on his cell phone LMTCB.  Return call: Patient called back to say [facility]  Patient states he will bring his chip to Monday's office visit 09/15/2016 so we can get a download.

## 2016-09-15 ENCOUNTER — Ambulatory Visit (INDEPENDENT_AMBULATORY_CARE_PROVIDER_SITE_OTHER): Payer: BLUE CROSS/BLUE SHIELD | Admitting: Cardiology

## 2016-09-15 ENCOUNTER — Encounter: Payer: Self-pay | Admitting: Cardiology

## 2016-09-15 VITALS — BP 124/64 | HR 76 | Ht 72.0 in | Wt 281.0 lb

## 2016-09-15 DIAGNOSIS — G4733 Obstructive sleep apnea (adult) (pediatric): Secondary | ICD-10-CM | POA: Diagnosis not present

## 2016-09-15 DIAGNOSIS — R079 Chest pain, unspecified: Secondary | ICD-10-CM

## 2016-09-15 DIAGNOSIS — Z6838 Body mass index (BMI) 38.0-38.9, adult: Secondary | ICD-10-CM | POA: Diagnosis not present

## 2016-09-15 DIAGNOSIS — I10 Essential (primary) hypertension: Secondary | ICD-10-CM | POA: Diagnosis not present

## 2016-09-15 NOTE — Progress Notes (Signed)
Cardiology Office Note    Date:  09/15/2016   ID:  Rodney Dominguez, DOB October 15, 1970, MRN [id number]  PCP:  Blair Heys, MD  Cardiologist:  Armanda Magic, MD   Chief Complaint  Patient presents with  . Sleep Apnea    History of Present Illness:  Rodney Dominguez is a 46 y.o. male with a history of OSA on CPAP, HTN and obesity.  He is here for followup today and is doing well. He tolerates his CPAP therapy with no problems. He continues to feel rested in the am and has no significant  daytime sleepiness when using his CPAP.  Occasionally he naps but attributes that to his child.  He continues to tolerate the nasal pillow mask and feels the pressure is adequate. He denies any nasal congestion. He has not had any significant mouth or nasal dryness.  He has continued to gain weight and since 2016 has gained 14 pounds (47lbs in 2 years).  He says that his weight has been fluctuating and and has not been exercising.  He is more motivated to lose weight and exercise now.  He was recently in the ER with complaints of chest pain that was midsternal and did not radiate.  He did have some mild DOE.  The pain was described as a pressure sensation with no radiation.  He did get diaphoretic with dizziness. It started on a Friday and had several episodes and was told to go to the ER.  Trop was neg x 3. EKG was nonischemic.  Nuclear stress test showed no ischemia.  He was started on a PPI and he has not had any further CP.     Past Medical History:  Diagnosis Date  . Diabetes mellitus without complication (HCC)    type II  . Hypercholesteremia   . Hypertension   . Kidney stone    left -first ever; surgery planned  . Obesity   . Sleep apnea    cpap- settings 11    Past Surgical History:  Procedure Laterality Date  . CYSTOSCOPY W/ RETROGRADES Left 10/28/2013   Procedure: CYSTOSCOPY WITH RETROGRADE PYELOGRAM LEFT URETEROSCOPY AND STENT PLACEMENT;  Surgeon: Sebastian Ache, MD;  Location: [facility];   Service: Urology;  Laterality: Left;  . NEPHROLITHOTOMY Left 10/28/2013   Procedure: NEPHROLITHOTOMY PERCUTANEOUS WITH ACCESS;  Surgeon: Sebastian Ache, MD;  Location: [facility];  Service: Urology;  Laterality: Left;  . WISDOM TOOTH EXTRACTION      Current Medications: Current Meds  Medication Sig  . amLODipine (NORVASC) 10 MG tablet Take 1 tablet (10 mg total) by mouth daily.  Marland Kitchen aspirin EC 81 MG tablet Take 1 tablet (81 mg total) by mouth every morning.  Marland Kitchen atorvastatin (LIPITOR) 40 MG tablet Take 40 mg by mouth daily.  Marland Kitchen doxazosin (CARDURA) 4 MG tablet Take 4 mg by mouth daily.  Marland Kitchen EPIPEN 2-PAK 0.3 MG/0.3ML SOAJ Inject 0.3 mg into the skin as needed (Allergic reaction).   . metoprolol succinate (TOPROL-XL) 50 MG 24 hr tablet Take 50 mg by mouth daily. Take with or immediately following a meal.  . Multiple Vitamins-Minerals (MULTIVITAMIN WITH MINERALS) tablet Take 1 tablet by mouth daily.  . pantoprazole (PROTONIX) 40 MG tablet Take 1 tablet (40 mg total) by mouth daily.  . potassium chloride SA (K-DUR,KLOR-CON) 20 MEQ tablet Take 40 mEq by mouth daily.  . valsartan-hydrochlorothiazide (DIOVAN-HCT) 320-25 MG tablet Take 1 tablet by mouth daily.    Allergies:   Bee venom and Ace inhibitors  Social History   Social History  . Marital status: Married    Spouse name: N/A  . Number of children: N/A  . Years of education: N/A   Social History Main Topics  . Smoking status: Former Smoker    Types: Pipe, Cigars    Quit date: 10/21/2003  . Smokeless tobacco: Never Used     Comment: ocassionally smokes a cigar  . Alcohol use No     Comment: Heavy durin g college years, Quit '99  . Drug use: No  . Sexual activity: Yes   Other Topics Concern  . None   Social History Narrative  . None     Family History:  The patient's family history includes Cancer in his mother; Diabetes in his mother; Hyperlipidemia in his father and mother; Hypertension in his father and mother.   ROS:   Please  see the history of present illness.    Review of Systems  Cardiovascular: Positive for dyspnea on exertion.  Neurological: Positive for dizziness and headaches.   All other systems reviewed and are negative.  No flowsheet data found.     PHYSICAL EXAM:   VS:  BP 124/64   Pulse 76   Ht 6' (1.829 m)   Wt 281 lb (127.5 kg)   SpO2 96%   BMI 38.11 kg/m    GEN: Well nourished, well developed, in no acute distress  HEENT: normal  Neck: no JVD, carotid bruits, or masses Cardiac: RRR; no murmurs, rubs, or gallops,no edema.  Intact distal pulses bilaterally.  Respiratory:  clear to auscultation bilaterally, normal work of breathing GI: soft, nontender, nondistended, + BS MS: no deformity or atrophy  Skin: warm and dry, no rash Neuro:  Alert and Oriented x 3, Strength and sensation are intact Psych: euthymic mood, full affect  Wt Readings from Last 3 Encounters:  09/15/16 281 lb (127.5 kg)  09/03/16 278 lb 6.4 oz (126.3 kg)  09/06/15 273 lb 6.4 oz (124 kg)      Studies/Labs Reviewed:   EKG:  EKG is not ordered today.  Recent Labs: 09/02/2016: BUN 12; Potassium 3.0; Sodium 139 09/03/2016: Creatinine, Ser 0.92; Hemoglobin 12.9; Platelets 249   Lipid Panel No results found for: CHOL, TRIG, HDL, CHOLHDL, VLDL, LDLCALC, LDLDIRECT  Additional studies/ records that were reviewed today include:  CPAP download    ASSESSMENT:    1. Obstructive sleep apnea   2. Essential hypertension, benign   3. Class 2 severe obesity due to excess calories with serious comorbidity and body mass index (BMI) of 38.0 to 38.9 in adult [facility](HCC)   4. Chest pain, unspecified type      PLAN:  In order of problems listed above:  OSA - the patient is tolerating PAP therapy well without any problems. The PAP download was reviewed today and showed an AHI of 0.5/hr on 11 cm H2O with 96% compliance in using more than 4 hours nightly.  The patient has been using and benefiting from CPAP use and will continue  to benefit from therapy.  HTN - His BP is well tolerated on exam today.  He will continue on amlodipine 10mg  daily, cardura 4mg  daily, Toprol XL 50mg  daily and Diovan HCT 320-25mg  daily.   3.   Obesity - I have encouraged him to get into a routine exercise program and cut back on carbs and portions.  4.   Atypical CP likely GI related.  Nuclear stress test showed no ischemia and he has not had any further  symptoms on PPI.    Medication Adjustments/Labs and Tests Ordered: Current medicines are reviewed at length with the patient today.  Concerns regarding medicines are outlined above.  Medication changes, Labs and Tests ordered today are listed in the Patient Instructions below.  There are no Patient Instructions on file for this visit.   Signed, Armanda Magic, MD  09/15/2016 3:12 PM    [facility] [address] [place], [place], [place]  [address] Phone: [phone]; Fax: [phone]

## 2016-09-15 NOTE — Patient Instructions (Signed)

## 2017-03-10 DIAGNOSIS — N200 Calculus of kidney: Secondary | ICD-10-CM | POA: Diagnosis not present

## 2017-03-10 DIAGNOSIS — Z3009 Encounter for other general counseling and advice on contraception: Secondary | ICD-10-CM | POA: Diagnosis not present

## 2017-04-23 DIAGNOSIS — Z302 Encounter for sterilization: Secondary | ICD-10-CM | POA: Diagnosis not present

## 2017-07-09 ENCOUNTER — Other Ambulatory Visit: Payer: Self-pay

## 2017-07-09 ENCOUNTER — Encounter (HOSPITAL_COMMUNITY): Payer: Self-pay | Admitting: Emergency Medicine

## 2017-07-09 ENCOUNTER — Ambulatory Visit (HOSPITAL_COMMUNITY)
Admission: EM | Admit: 2017-07-09 | Discharge: 2017-07-09 | Disposition: A | Discharge status: Home / Self Care | Payer: BLUE CROSS/BLUE SHIELD

## 2017-07-09 DIAGNOSIS — R112 Nausea with vomiting, unspecified: Secondary | ICD-10-CM | POA: Diagnosis not present

## 2017-07-09 DIAGNOSIS — R197 Diarrhea, unspecified: Secondary | ICD-10-CM | POA: Diagnosis not present

## 2017-07-09 DIAGNOSIS — E860 Dehydration: Secondary | ICD-10-CM

## 2017-07-09 MED ORDER — ONDANSETRON 4 MG PO TBDP: 4.0000 mg | ORAL_TABLET | Freq: Three times a day (TID) | ORAL | 0 refills | Status: DC | PRN

## 2017-07-09 MED ORDER — ONDANSETRON HCL 4 MG/2ML IJ SOLN
4.0000 mg | Freq: Once | INTRAMUSCULAR | Status: AC
  Administered 2017-07-09: 4 mg via INTRAMUSCULAR

## 2017-07-09 MED ORDER — SODIUM CHLORIDE 0.9 % IV BOLUS
1000.0000 mL | Freq: Once | INTRAVENOUS | Status: AC
  Administered 2017-07-09: 1000 mL via INTRAVENOUS

## 2017-07-09 MED ORDER — ONDANSETRON HCL 4 MG/2ML IJ SOLN
INTRAMUSCULAR | Status: AC
  Filled 2017-07-09: qty 2

## 2017-07-09 NOTE — Discharge Instructions (Signed)

## 2017-07-09 NOTE — ED Triage Notes (Signed)
Vomiting and diarrhea started Monday.  Seemed to be better over Tuesday and Wednesday.  Did have some loose stools over the past two days, but no vomiting.  Today went to work, but resumed nausea, vomiting and diarrhea today.

## 2017-07-09 NOTE — ED Provider Notes (Signed)
[facility]    CSN: [id number] Arrival date & time: 07/09/17  1525     History   Chief Complaint Chief Complaint  Patient presents with  . Emesis    HPI Rodney Dominguez is a 47 y.o. male presenting today with nausea, vomiting and diarrhea.  States that on Monday he woke up with vomiting and diarrhea.  Tuesday and Wednesday he did not have much nausea and vomiting, but still persisted to have loose stools occasionally watery.  He ate dinner last night and tolerated well without issue.  This morning he woke up and began to feel nauseous again and later today developed significant nausea and vomiting that has persisted.  He has had chills, mild abdominal cramping, lightheadedness and dizziness.  Overall minimal abdominal pain.  HPI  Past Medical History:  Diagnosis Date  . Diabetes mellitus without complication (HCC)    type II  . Hypercholesteremia   . Hypertension   . Kidney stone    left -first ever; surgery planned  . Obesity   . Sleep apnea    cpap- settings 11    Patient Active Problem List   Diagnosis Date Noted  . Chest pain 09/02/2016  . Staghorn calculus 10/28/2013  . Obstructive sleep apnea 06/20/2013  . Essential hypertension, benign 06/20/2013  . Obesity 06/20/2013    Past Surgical History:  Procedure Laterality Date  . CYSTOSCOPY W/ RETROGRADES Left 10/28/2013   Procedure: CYSTOSCOPY WITH RETROGRADE PYELOGRAM LEFT URETEROSCOPY AND STENT PLACEMENT;  Surgeon: Sebastian Ache, MD;  Location: [facility];  Service: Urology;  Laterality: Left;  . NEPHROLITHOTOMY Left 10/28/2013   Procedure: NEPHROLITHOTOMY PERCUTANEOUS WITH ACCESS;  Surgeon: Sebastian Ache, MD;  Location: [facility];  Service: Urology;  Laterality: Left;  . WISDOM TOOTH EXTRACTION         Home Medications    Prior to Admission medications   Medication Sig Start Date End Date Taking? Authorizing Provider  ranitidine (RANITIDINE 150 MAX STRENGTH) 150 MG tablet Take 150 mg by mouth 2 (two)  times daily.   Yes [provider]  telmisartan-hydrochlorothiazide (MICARDIS HCT) 80-25 MG tablet Take 1 tablet by mouth daily.   Yes [provider]  amLODipine (NORVASC) 10 MG tablet Take 1 tablet (10 mg total) by mouth daily. 09/04/16   Joseph Art, DO  aspirin EC 81 MG tablet Take 1 tablet (81 mg total) by mouth every morning. 09/08/15   Jerilee Field, MD  atorvastatin (LIPITOR) 40 MG tablet Take 40 mg by mouth daily. 09/12/14   [provider]  doxazosin (CARDURA) 4 MG tablet Take 4 mg by mouth daily.    [provider]  EPIPEN 2-PAK 0.3 MG/0.3ML SOAJ Inject 0.3 mg into the skin as needed (Allergic reaction).  08/02/12   [provider]  metoprolol succinate (TOPROL-XL) 50 MG 24 hr tablet Take 50 mg by mouth daily. Take with or immediately following a meal.    [provider]  Multiple Vitamins-Minerals (MULTIVITAMIN WITH MINERALS) tablet Take 1 tablet by mouth daily.    [provider]  ondansetron (ZOFRAN ODT) 4 MG disintegrating tablet Take 1 tablet (4 mg total) by mouth every 8 (eight) hours as needed for nausea or vomiting. 07/09/17   Rojean Ige C, PA-C  pantoprazole (PROTONIX) 40 MG tablet Take 1 tablet (40 mg total) by mouth daily. 09/03/16   Joseph Art, DO  potassium chloride SA (K-DUR,KLOR-CON) 20 MEQ tablet Take 40 mEq by mouth daily.    [provider]  valsartan-hydrochlorothiazide (DIOVAN-HCT) 320-25 MG tablet Take 1 tablet by mouth daily.    [provider]    Family History Family History  Problem Relation Age of Onset  . Hypertension Mother   . Hyperlipidemia Mother   . Cancer Mother   . Diabetes Mother   . Hypertension Father   . Hyperlipidemia Father     Social History Social History   Tobacco Use  . Smoking status: Former Smoker    Types: Pipe, Cigars    Last attempt to quit: 10/21/2003    Years since quitting: 13.7  . Smokeless tobacco: Never Used  . Tobacco comment:  ocassionally smokes a cigar  Substance Use Topics  . Alcohol use: No    Comment: Heavy durin g college years, Quit '99  . Drug use: No     Allergies   Bee venom and Ace inhibitors   Review of Systems Review of Systems  Constitutional: Positive for appetite change, chills and fatigue. Negative for activity change and fever.  HENT: Negative for congestion, ear pain, postnasal drip, rhinorrhea, sinus pressure and sore throat.   Eyes: Negative for pain and itching.  Respiratory: Negative for cough and shortness of breath.   Cardiovascular: Negative for chest pain.  Gastrointestinal: Positive for diarrhea, nausea and vomiting. Negative for abdominal pain.  Musculoskeletal: Negative for myalgias.  Skin: Negative for rash.  Neurological: Positive for dizziness, weakness and light-headedness. Negative for headaches.     Physical Exam Triage Vital Signs ED Triage Vitals  Enc Vitals Group     BP 07/09/17 1611 120/65     Pulse Rate 07/09/17 1611 (!) 117     Resp 07/09/17 1611 20     Temp 07/09/17 1611 97.8 F (36.6 C)     Temp Source 07/09/17 1611 Oral     SpO2 07/09/17 1611 96 %     Weight --      Height --      Head Circumference --      Peak Flow --      Pain Score 07/09/17 1607 0     Pain Loc --      Pain Edu? --      Excl. in GC? --    No data found.  Updated Vital Signs BP 120/65 (BP Location: Left Arm)   Pulse (!) 117   Temp 97.8 F (36.6 C) (Oral)   Resp 20   SpO2 96%   Visual Acuity Right Eye Distance:   Left Eye Distance:   Bilateral Distance:    Right Eye Near:   Left Eye Near:    Bilateral Near:     Physical Exam  Constitutional: He appears well-developed and well-nourished.  Patient appears pale  HENT:  Head: Normocephalic and atraumatic.  Mouth/Throat: Oropharynx is clear and moist.  Eyes: Conjunctivae are normal.  Neck: Neck supple.  Cardiovascular: Regular rhythm.  No murmur heard. Tachycardic  Pulmonary/Chest: Effort normal and breath  sounds normal. No respiratory distress.  Abdominal: Soft. There is no tenderness.  Nontender to light and deep palpation  Musculoskeletal: He exhibits no edema.  Neurological: He is alert.  Skin: Skin is warm and dry.  Psychiatric: He has a normal mood and affect.  Nursing note and vitals reviewed.    UC Treatments / Results  Labs (all labs ordered are listed, but only abnormal results are displayed) Labs Reviewed - No data to display  EKG None Radiology No results found.  Procedures Procedures (including critical care time)  Medications Ordered  in UC Medications  ondansetron (ZOFRAN) injection 4 mg (4 mg Intramuscular Given 07/09/17 1657)  sodium chloride 0.9 % bolus 1,000 mL (0 mLs Intravenous Stopped 07/09/17 1811)     Initial Impression / Assessment and Plan / UC Course  I have reviewed the triage vital signs and the nursing notes.  Pertinent labs & imaging results that were available during my care of the patient were reviewed by me and considered in my medical decision making (see chart for details).     Patient actively vomiting in clinic, will provide Zofran IM.  IV fluids given significant paleness and tachycardia.  Abdominal exam unremarkable, likely viral gastroenteritis with dehydration.  Patient felt significantly improved after Zofran as well as fluids.  Will send home with Zofran to use as needed.  Discussed dietary recommendations. Discussed strict return precautions. Patient verbalized understanding and is agreeable with plan.   Final Clinical Impressions(s) / UC Diagnoses   Final diagnoses:  Nausea vomiting and diarrhea  Dehydration    ED Discharge Orders        Ordered    ondansetron (ZOFRAN ODT) 4 MG disintegrating tablet  Every 8 hours PRN     07/09/17 1738       Controlled Substance Prescriptions [place] Controlled Substance Registry consulted? Not Applicable   Lew Dawes, [place] 07/09/17 2133

## 2017-08-26 DIAGNOSIS — R739 Hyperglycemia, unspecified: Secondary | ICD-10-CM | POA: Diagnosis not present

## 2017-11-30 DIAGNOSIS — E7800 Pure hypercholesterolemia, unspecified: Secondary | ICD-10-CM | POA: Diagnosis not present

## 2017-11-30 DIAGNOSIS — Z0000 Encounter for general adult medical examination without abnormal findings: Secondary | ICD-10-CM | POA: Diagnosis not present

## 2017-11-30 DIAGNOSIS — I10 Essential (primary) hypertension: Secondary | ICD-10-CM | POA: Diagnosis not present

## 2017-11-30 DIAGNOSIS — E119 Type 2 diabetes mellitus without complications: Secondary | ICD-10-CM | POA: Diagnosis not present

## 2017-12-02 DIAGNOSIS — M1812 Unilateral primary osteoarthritis of first carpometacarpal joint, left hand: Secondary | ICD-10-CM | POA: Diagnosis not present

## 2017-12-09 DIAGNOSIS — M79645 Pain in left finger(s): Secondary | ICD-10-CM | POA: Diagnosis not present

## 2017-12-09 DIAGNOSIS — M1812 Unilateral primary osteoarthritis of first carpometacarpal joint, left hand: Secondary | ICD-10-CM | POA: Diagnosis not present

## 2017-12-24 ENCOUNTER — Encounter: Payer: BLUE CROSS/BLUE SHIELD | Attending: Family Medicine | Admitting: Dietician

## 2017-12-24 ENCOUNTER — Encounter: Payer: Self-pay | Admitting: Dietician

## 2017-12-24 DIAGNOSIS — Z713 Dietary counseling and surveillance: Secondary | ICD-10-CM | POA: Insufficient documentation

## 2017-12-24 DIAGNOSIS — E119 Type 2 diabetes mellitus without complications: Secondary | ICD-10-CM | POA: Insufficient documentation

## 2017-12-24 NOTE — Progress Notes (Signed)
Patient was seen on 12/24/17 for the first of a series of three diabetes self-management courses at the [facility].  Patient Education Plan per assessed needs and concerns is to attend three course education program for Diabetes Self Management Education.  The following learning objectives were met by the patient during this class:  Describe diabetes  State some common risk factors for diabetes  Defines the role of glucose and insulin  Identifies type of diabetes and pathophysiology  Describe the relationship between diabetes and cardiovascular risk  State the members of the Healthcare Team  States the rationale for glucose monitoring  State when to test glucose  State their individual Target Range  State the importance of logging glucose readings  Describe how to interpret glucose readings  Identifies A1C target  Explain the correlation between A1c and eAG values  State symptoms and treatment of high blood glucose  State symptoms and treatment of low blood glucose  Explain proper technique for glucose testing  Identifies proper sharps disposal  Handouts given during class include:  ADA Diabetes You Take Control   Carb Counting and Meal Planning book  Meal Plan Card  Meal planning worksheet  Low Sodium Flavoring Tips  Types of Fats  The diabetes portion plate  A1c to eAG Conversion Chart  Diabetes Recommended Care Schedule  Support Group  Diabetes Success Plan  Core Class Satisfaction Survey   Follow-Up Plan:  Attend core 2   

## 2017-12-31 ENCOUNTER — Encounter: Payer: Self-pay | Admitting: Dietician

## 2017-12-31 ENCOUNTER — Encounter: Payer: BLUE CROSS/BLUE SHIELD | Admitting: Dietician

## 2017-12-31 DIAGNOSIS — E119 Type 2 diabetes mellitus without complications: Secondary | ICD-10-CM

## 2017-12-31 DIAGNOSIS — Z713 Dietary counseling and surveillance: Secondary | ICD-10-CM | POA: Diagnosis not present

## 2017-12-31 NOTE — Progress Notes (Signed)
Patient was seen on 12/31/17 for the second of a series of three diabetes self-management courses at the [facility]. The following learning objectives were met by the patient during this class:   Describe the role of different macronutrients on glucose  Explain how carbohydrates affect blood glucose  State what foods contain the most carbohydrates  Demonstrate carbohydrate counting  Demonstrate how to read Nutrition Facts food label  Describe effects of various fats on heart health  Describe the importance of good nutrition for health and healthy eating strategies  Describe techniques for managing your shopping, cooking and meal planning  List strategies to follow meal plan when dining out  Describe the effects of alcohol on glucose and how to use it safely  Goals:  Follow Diabetes Meal Plan as instructed  Aim to spread carbs evenly throughout the day  Aim for 3 meals per day and snacks as needed Include lean protein foods to meals/snacks  Monitor glucose levels as instructed by your doctor   Follow-Up Plan:  Attend Core 3  Work towards following your personal food plan.   

## 2018-01-07 ENCOUNTER — Encounter: Payer: BLUE CROSS/BLUE SHIELD | Admitting: Dietician

## 2018-01-07 ENCOUNTER — Encounter: Payer: Self-pay | Admitting: Dietician

## 2018-01-07 DIAGNOSIS — E119 Type 2 diabetes mellitus without complications: Secondary | ICD-10-CM | POA: Diagnosis not present

## 2018-01-07 DIAGNOSIS — Z713 Dietary counseling and surveillance: Secondary | ICD-10-CM | POA: Diagnosis not present

## 2018-01-07 NOTE — Progress Notes (Signed)
Patient was seen on 01/07/18 for the third of a series of three diabetes self-management courses at the [facility].   Rodney Dominguez the amount of activity recommended for healthy living . Describe activities suitable for individual needs . Identify ways to regularly incorporate activity into daily life . Identify barriers to activity and ways to over come these barriers  Identify diabetes medications being personally used and their primary action for lowering glucose and possible side effects . Describe role of stress on blood glucose and develop strategies to address psychosocial issues . Identify diabetes complications and ways to prevent them  Explain how to manage diabetes during illness . Evaluate success in meeting personal goal . Establish 2-3 goals that they will plan to diligently work on  Goals:   I will count my carb choices at most meals and snacks  I will be active 30 minutes or more 3 times a week  I will test my glucose at least 2 times a day, 7 days a week  Your patient has identified these potential barriers to change:  Motivation - being tired  Your patient has identified their diabetes self-care support plan as  On-line Resources  [place] [employer]    Plan:  Attend Support Group as desired

## 2018-01-20 ENCOUNTER — Ambulatory Visit: Payer: BLUE CROSS/BLUE SHIELD | Admitting: Cardiology

## 2018-01-20 ENCOUNTER — Encounter: Payer: Self-pay | Admitting: Cardiology

## 2018-01-20 VITALS — BP 124/90 | HR 77 | Ht 72.0 in | Wt 287.1 lb

## 2018-01-20 DIAGNOSIS — I10 Essential (primary) hypertension: Secondary | ICD-10-CM

## 2018-01-20 DIAGNOSIS — G4733 Obstructive sleep apnea (adult) (pediatric): Secondary | ICD-10-CM | POA: Diagnosis not present

## 2018-01-20 NOTE — Patient Instructions (Signed)
Medication Instructions:  Your physician recommends that you continue on your current medications as directed. Please refer to the Current Medication list given to you today.  If you need a refill on your cardiac medications before your next appointment, please call your pharmacy.   Lab work: None  If you have labs (blood work) drawn today and your tests are completely normal, you will receive your results only by: Marland Kitchen MyChart Message (if you have MyChart) OR . A paper copy in the mail If you have any lab test that is abnormal or we need to change your treatment, we will call you to review the results.  Follow-Up: At [facility], you and your health needs are our priority.  As part of our continuing mission to provide you with exceptional heart care, we have created designated Provider Care Teams.  These Care Teams include your primary Cardiologist (physician) and Advanced Practice Providers (APPs -  Physician Assistants and Nurse Practitioners) who all work together to provide you with the care you need, when you need it. You will need a follow up appointment in 1 years.  Please call our office 2 months in advance to schedule this appointment.  You may see Dr. Mayford Knife or one of the following Advanced Practice Providers on your designated Care Team:   [place], PA-C Ronie Spies, PA-C . Jacolyn Reedy, PA-C

## 2018-01-20 NOTE — Progress Notes (Signed)
Cardiology Office Note:    Date:  01/20/2018   ID:  Rodney Dominguez, DOB [birth year], MRN [id number]  PCP:  Blair Heys, MD  Cardiologist:  No primary care provider on file.    Referring MD: Blair Heys, MD   Chief Complaint  Patient presents with  . Sleep Apnea  . Hypertension    History of Present Illness:    Rodney Dominguez is a 47 y.o. male with a hx of OSA on CPAP, HTN and obesity.  He is doing well with his CPAP device and thinks that he has gotten used to it.  He tolerates the mask and feels the pressure is adequate.  Since going on CPAP he feels rested in the am and has no significant daytime sleepiness.  He denies any significant mouth or nasal dryness or nasal congestion.  He does not think that he snores.     Past Medical History:  Diagnosis Date  . Diabetes mellitus without complication (HCC)    type II  . Hypercholesteremia   . Hypertension   . Kidney stone    left -first ever; surgery planned  . Obesity   . Sleep apnea    cpap- settings 11    Past Surgical History:  Procedure Laterality Date  . CYSTOSCOPY W/ RETROGRADES Left 10/28/2013   Procedure: CYSTOSCOPY WITH RETROGRADE PYELOGRAM LEFT URETEROSCOPY AND STENT PLACEMENT;  Surgeon: Sebastian Ache, MD;  Location: [facility];  Service: Urology;  Laterality: Left;  . NEPHROLITHOTOMY Left 10/28/2013   Procedure: NEPHROLITHOTOMY PERCUTANEOUS WITH ACCESS;  Surgeon: Sebastian Ache, MD;  Location: [facility];  Service: Urology;  Laterality: Left;  . WISDOM TOOTH EXTRACTION      Current Medications: Current Meds  Medication Sig  . amLODipine (NORVASC) 10 MG tablet Take 1 tablet (10 mg total) by mouth daily.  Marland Kitchen aspirin EC 81 MG tablet Take 1 tablet (81 mg total) by mouth every morning.  Marland Kitchen atorvastatin (LIPITOR) 40 MG tablet Take 40 mg by mouth daily.  Marland Kitchen doxazosin (CARDURA) 4 MG tablet Take 4 mg by mouth daily.  Marland Kitchen EPIPEN 2-PAK 0.3 MG/0.3ML SOAJ Inject 0.3 mg into the skin as needed (Allergic reaction).   . metFORMIN  (GLUCOPHAGE-XR) 500 MG 24 hr tablet Take 500 mg by mouth 2 (two) times daily with a meal.  . metoprolol succinate (TOPROL-XL) 50 MG 24 hr tablet Take 50 mg by mouth daily. Take with or immediately following a meal.  . Multiple Vitamins-Minerals (MULTIVITAMIN WITH MINERALS) tablet Take 1 tablet by mouth daily.  . pantoprazole (PROTONIX) 40 MG tablet Take 1 tablet (40 mg total) by mouth daily.  . potassium chloride SA (K-DUR,KLOR-CON) 20 MEQ tablet Take 40 mEq by mouth daily.  . ranitidine (RANITIDINE 150 MAX STRENGTH) 150 MG tablet Take 150 mg by mouth 2 (two) times daily.  Marland Kitchen telmisartan-hydrochlorothiazide (MICARDIS HCT) 80-12.5 MG tablet Take 1 tablet by mouth daily.     Allergies:   Bee venom and Ace inhibitors   Social History   Socioeconomic History  . Marital status: Married    Spouse name: Not on file  . Number of children: Not on file  . Years of education: Not on file  . Highest education level: Not on file  Occupational History  . Not on file  Social Needs  . Financial resource strain: Not on file  . Food insecurity:    Worry: Not on file    Inability: Not on file  . Transportation needs:    Medical: Not on file  Non-medical: Not on file  Tobacco Use  . Smoking status: Former Smoker    Types: Pipe, Cigars    Last attempt to quit: 10/21/2003    Years since quitting: 14.2  . Smokeless tobacco: Never Used  . Tobacco comment: ocassionally smokes a cigar  Substance and Sexual Activity  . Alcohol use: No    Comment: Heavy durin g college years, Quit '99  . Drug use: No  . Sexual activity: Yes  Lifestyle  . Physical activity:    Days per week: Not on file    Minutes per session: Not on file  . Stress: Not on file  Relationships  . Social connections:    Talks on phone: Not on file    Gets together: Not on file    Attends religious service: Not on file    Active member of club or organization: Not on file    Attends meetings of clubs or organizations: Not on file      Relationship status: Not on file  Other Topics Concern  . Not on file  Social History Narrative  . Not on file     Family History: The patient's family history includes Cancer in his mother; Diabetes in his mother; Hyperlipidemia in his father and mother; Hypertension in his father and mother.  ROS:   Please see the history of present illness.    ROS  All other systems reviewed and negative.   EKGs/Labs/Other Studies Reviewed:    The following studies were reviewed today: PAP download  EKG:  EKG is not ordered today.    Recent Labs: No results found for requested labs within last 8760 hours.   Recent Lipid Panel No results found for: CHOL, TRIG, HDL, CHOLHDL, VLDL, LDLCALC, LDLDIRECT  Physical Exam:    VS:  BP 124/90   Pulse 77   Ht 6' (1.829 m)   Wt 287 lb 1.9 oz (130.2 kg)   SpO2 97%   BMI 38.94 kg/m     Wt Readings from Last 3 Encounters:  01/20/18 287 lb 1.9 oz (130.2 kg)  09/15/16 281 lb (127.5 kg)  09/03/16 278 lb 6.4 oz (126.3 kg)     GEN:  Well nourished, well developed in no acute distress HEENT: Normal NECK: No JVD; No carotid bruits LYMPHATICS: No lymphadenopathy CARDIAC: RRR, no murmurs, rubs, gallops RESPIRATORY:  Clear to auscultation without rales, wheezing or rhonchi  ABDOMEN: Soft, non-tender, non-distended MUSCULOSKELETAL:  No edema; No deformity  SKIN: Warm and dry NEUROLOGIC:  Alert and oriented x 3 PSYCHIATRIC:  Normal affect   ASSESSMENT:    1. Obstructive sleep apnea   2. Essential hypertension, benign   3. Class 2 severe obesity due to excess calories with serious comorbidity in adult, unspecified BMI (HCC)    PLAN:    In order of problems listed above:  1.  OSA - the patient is tolerating PAP therapy well without any problems. The PAP download was reviewed today and showed an AHI of 0.6/hr on 11 cm H2O with 87% compliance in using more than 4 hours nightly.  The patient has been using and benefiting from PAP use and will  continue to benefit from therapy.   2.  HTN - Bp is controlled on exam today.  He will continue on Micardis HCT 80-25mg  daily, Doxazosin 4mg  daily, Toprol XL 50mg  daily.    3. Obesity - I have encouraged him to get into a routine exercise program and cut back on carbs and portions.  Medication Adjustments/Labs and Tests Ordered: Current medicines are reviewed at length with the patient today.  Concerns regarding medicines are outlined above.  No orders of the defined types were placed in this encounter.  No orders of the defined types were placed in this encounter.   Signed, Armanda Magic, MD  01/20/2018 9:59 AM    [facility]

## 2018-02-23 DIAGNOSIS — R34 Anuria and oliguria: Secondary | ICD-10-CM | POA: Diagnosis not present

## 2018-02-23 DIAGNOSIS — N200 Calculus of kidney: Secondary | ICD-10-CM | POA: Diagnosis not present

## 2018-03-19 DIAGNOSIS — M25511 Pain in right shoulder: Secondary | ICD-10-CM | POA: Diagnosis not present

## 2018-03-19 DIAGNOSIS — E1169 Type 2 diabetes mellitus with other specified complication: Secondary | ICD-10-CM | POA: Diagnosis not present

## 2018-03-19 DIAGNOSIS — I10 Essential (primary) hypertension: Secondary | ICD-10-CM | POA: Diagnosis not present

## 2018-03-19 DIAGNOSIS — G4733 Obstructive sleep apnea (adult) (pediatric): Secondary | ICD-10-CM | POA: Diagnosis not present

## 2018-03-25 DIAGNOSIS — R293 Abnormal posture: Secondary | ICD-10-CM | POA: Diagnosis not present

## 2018-03-25 DIAGNOSIS — M6281 Muscle weakness (generalized): Secondary | ICD-10-CM | POA: Diagnosis not present

## 2018-03-25 DIAGNOSIS — M25511 Pain in right shoulder: Secondary | ICD-10-CM | POA: Diagnosis not present

## 2018-04-01 DIAGNOSIS — R293 Abnormal posture: Secondary | ICD-10-CM | POA: Diagnosis not present

## 2018-04-01 DIAGNOSIS — M25511 Pain in right shoulder: Secondary | ICD-10-CM | POA: Diagnosis not present

## 2018-04-01 DIAGNOSIS — M6281 Muscle weakness (generalized): Secondary | ICD-10-CM | POA: Diagnosis not present

## 2018-04-16 DIAGNOSIS — M25511 Pain in right shoulder: Secondary | ICD-10-CM | POA: Diagnosis not present

## 2018-04-16 DIAGNOSIS — M6281 Muscle weakness (generalized): Secondary | ICD-10-CM | POA: Diagnosis not present

## 2018-04-16 DIAGNOSIS — R293 Abnormal posture: Secondary | ICD-10-CM | POA: Diagnosis not present

## 2018-04-23 DIAGNOSIS — R293 Abnormal posture: Secondary | ICD-10-CM | POA: Diagnosis not present

## 2018-04-23 DIAGNOSIS — M25511 Pain in right shoulder: Secondary | ICD-10-CM | POA: Diagnosis not present

## 2018-04-23 DIAGNOSIS — M6281 Muscle weakness (generalized): Secondary | ICD-10-CM | POA: Diagnosis not present

## 2018-05-05 DIAGNOSIS — M6281 Muscle weakness (generalized): Secondary | ICD-10-CM | POA: Diagnosis not present

## 2018-05-05 DIAGNOSIS — R293 Abnormal posture: Secondary | ICD-10-CM | POA: Diagnosis not present

## 2018-05-05 DIAGNOSIS — M25511 Pain in right shoulder: Secondary | ICD-10-CM | POA: Diagnosis not present

## 2018-06-14 DIAGNOSIS — E7800 Pure hypercholesterolemia, unspecified: Secondary | ICD-10-CM | POA: Diagnosis not present

## 2018-06-14 DIAGNOSIS — G4733 Obstructive sleep apnea (adult) (pediatric): Secondary | ICD-10-CM | POA: Diagnosis not present

## 2018-06-14 DIAGNOSIS — E1169 Type 2 diabetes mellitus with other specified complication: Secondary | ICD-10-CM | POA: Diagnosis not present

## 2018-06-14 DIAGNOSIS — I10 Essential (primary) hypertension: Secondary | ICD-10-CM | POA: Diagnosis not present

## 2018-11-04 DIAGNOSIS — E119 Type 2 diabetes mellitus without complications: Secondary | ICD-10-CM | POA: Diagnosis not present

## 2018-12-15 DIAGNOSIS — E1169 Type 2 diabetes mellitus with other specified complication: Secondary | ICD-10-CM | POA: Diagnosis not present

## 2018-12-15 DIAGNOSIS — Z6838 Body mass index (BMI) 38.0-38.9, adult: Secondary | ICD-10-CM | POA: Diagnosis not present

## 2018-12-15 DIAGNOSIS — I10 Essential (primary) hypertension: Secondary | ICD-10-CM | POA: Diagnosis not present

## 2018-12-15 DIAGNOSIS — Z23 Encounter for immunization: Secondary | ICD-10-CM | POA: Diagnosis not present

## 2018-12-15 DIAGNOSIS — E7800 Pure hypercholesterolemia, unspecified: Secondary | ICD-10-CM | POA: Diagnosis not present

## 2018-12-15 DIAGNOSIS — G4733 Obstructive sleep apnea (adult) (pediatric): Secondary | ICD-10-CM | POA: Diagnosis not present

## 2019-02-22 DIAGNOSIS — R609 Edema, unspecified: Secondary | ICD-10-CM | POA: Diagnosis not present

## 2019-02-28 DIAGNOSIS — N200 Calculus of kidney: Secondary | ICD-10-CM | POA: Diagnosis not present

## 2019-03-05 IMAGING — DX DG CHEST 2V
2 series · 2 of 2 positions shown · non-contrast
Comparison: 10/20/2013

CLINICAL DATA: Chest pain

EXAM:
CHEST  2 VIEW

[chest pa]
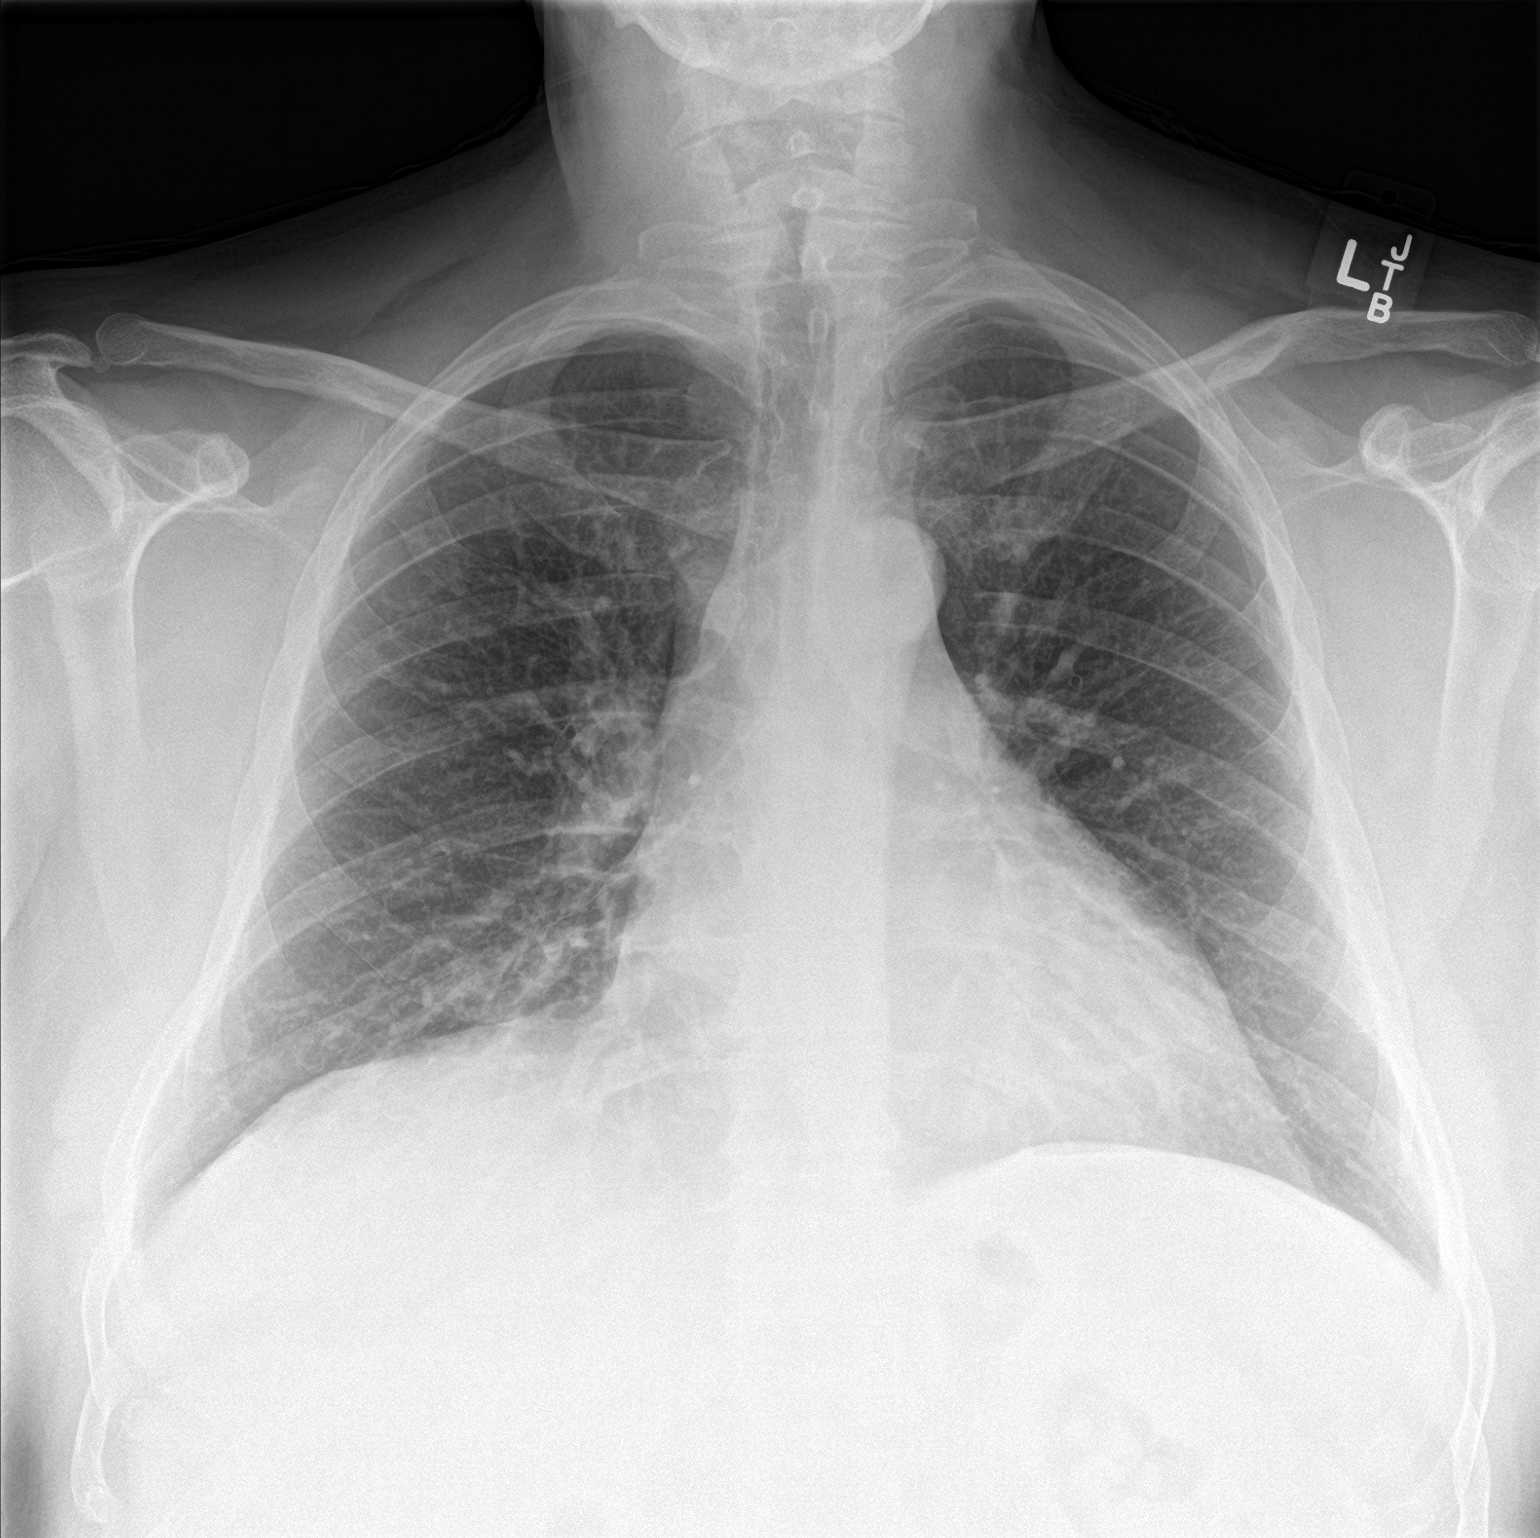

[chest lat]
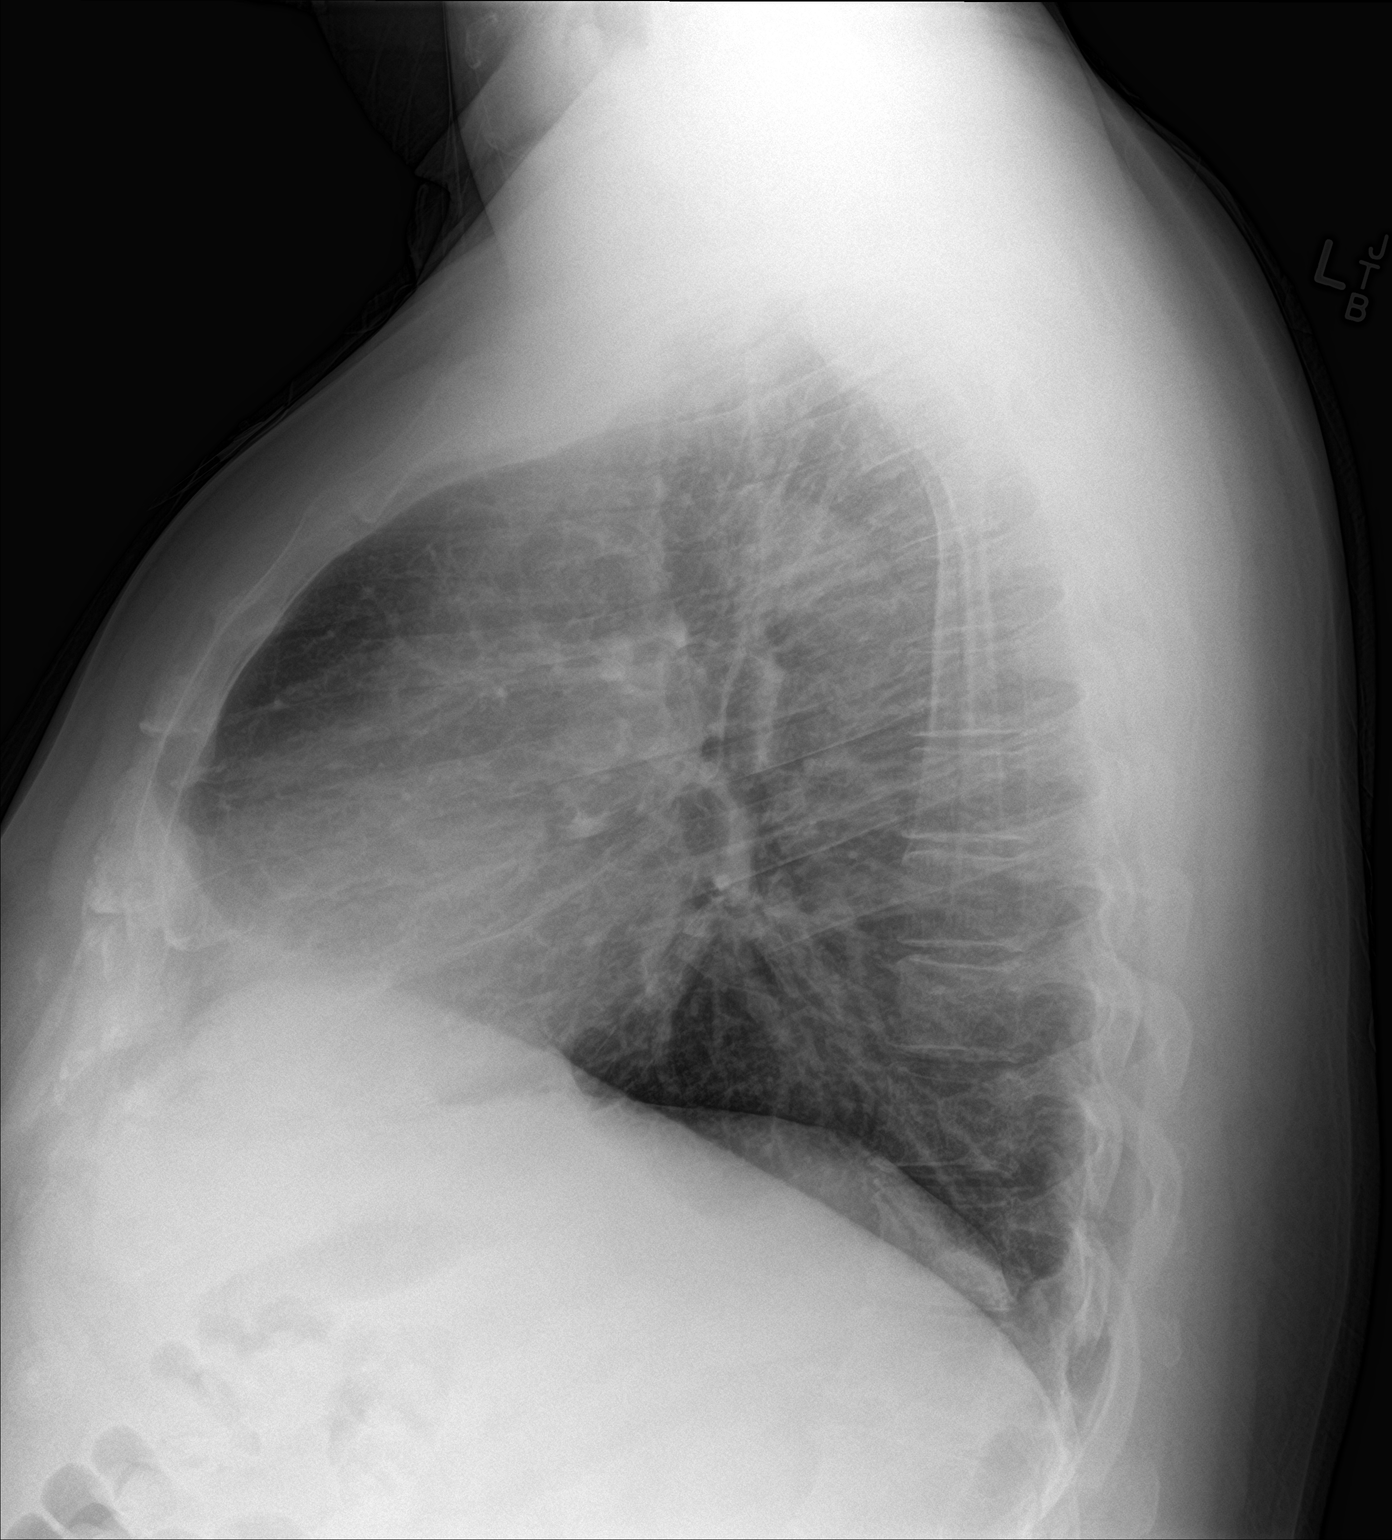

[2 of 2 positions shown; findings below may reference images not displayed]

FINDINGS: The heart size and mediastinal contours are within normal limits.
Both lungs are clear. The visualized skeletal structures are
unremarkable.
IMPRESSION: No active cardiopulmonary disease.

## 2019-04-01 DIAGNOSIS — I10 Essential (primary) hypertension: Secondary | ICD-10-CM | POA: Diagnosis not present

## 2019-04-12 ENCOUNTER — Telehealth: Payer: Self-pay | Admitting: *Deleted

## 2019-04-12 NOTE — Telephone Encounter (Signed)

## 2019-04-27 ENCOUNTER — Telehealth: Payer: Self-pay | Admitting: *Deleted

## 2019-04-27 NOTE — Telephone Encounter (Signed)
-----   Message from Quintella Reichert, MD sent at 04/25/2019  7:43 PM EST ----- Good AHI and compliance.  Continue current PAP settings.

## 2019-04-27 NOTE — Telephone Encounter (Signed)
Informed patient of compliance results and verbalized understanding was indicated. Patient is aware and agreeable to AHI being within range at 0.4. Patient is aware and agreeable to being in compliance with machine usage. Patient is aware and agreeable to no change in current pressures. 

## 2019-05-03 NOTE — Progress Notes (Signed)
Virtual Visit via Video Note   This visit type was conducted due to national recommendations for restrictions regarding the COVID-19 Pandemic (e.g. social distancing) in an effort to limit this patient's exposure and mitigate transmission in our community.  Due to his co-morbid illnesses, this patient is at least at moderate risk for complications without adequate follow up.  This format is felt to be most appropriate for this patient at this time.  All issues noted in this document were discussed and addressed.  A limited physical exam was performed with this format.  Please refer to the patient's chart for his consent to telehealth for [facility]   Evaluation Performed:  Follow-up visit  This visit type was conducted due to national recommendations for restrictions regarding the COVID-19 Pandemic (e.g. social distancing).  This format is felt to be most appropriate for this patient at this time.  All issues noted in this document were discussed and addressed.  No physical exam was performed (except for noted visual exam findings with Video Visits).  Please refer to the patient's chart (MyChart message for video visits and phone note for telephone visits) for the patient's consent to telehealth for [facility].  Date:  05/04/2019   ID:  Rodney Dominguez, DOB 1970-07-09, MRN [id number]  Patient Location:  Home  Provider location:   [place]  PCP:  Blair Heys, MD  Cardiologist:  Armanda Magic, MD Electrophysiologist:  None   Chief Complaint:  Followup HTN, OSA  History of Present Illness:    Rodney Dominguez is a 49 y.o. male who presents via audio/video conferencing for a telehealth visit today.    Rodney Dominguez is a 49 y.o. male with a hx of OSAon CPAP, HTN and obesity.  He is doing well with his CPAP device and thinks that he has gotten used to it.  He tolerates the mask and feels the pressure is adequate.  Since going on CPAP he feels rested in the am and has no  significant daytime sleepiness.  He denies any significant mouth or nasal dryness or nasal congestion.  He does not think that he snores.    He has recently been having problems with elevated BP.  He had to go off Amlodipine due to LE edema.  He does admits to using a new seasoning that has sodium in it and that was around the time his BP started to go up.  He is here today for followup and is doing well.  He denies any chest pain or pressure, SOB, DOE, PND, orthopnea, LE edema, dizziness, palpitations or syncope. He is compliant with his meds and is tolerating meds with no SE.    The patient does not have symptoms concerning for COVID-19 infection (fever, chills, cough, or new shortness of breath).    Prior CV studies:   The following studies were reviewed today:  PAP compliance download  Past Medical History:  Diagnosis Date  . Diabetes mellitus without complication (HCC)    type II  . Hypercholesteremia   . Hypertension   . Kidney stone    left -first ever; surgery planned  . Obesity   . Sleep apnea    cpap- settings 11   Past Surgical History:  Procedure Laterality Date  . CYSTOSCOPY W/ RETROGRADES Left 10/28/2013   Procedure: CYSTOSCOPY WITH RETROGRADE PYELOGRAM LEFT URETEROSCOPY AND STENT PLACEMENT;  Surgeon: Sebastian Ache, MD;  Location: [facility];  Service: Urology;  Laterality: Left;  . NEPHROLITHOTOMY Left 10/28/2013   Procedure: NEPHROLITHOTOMY PERCUTANEOUS  WITH ACCESS;  Surgeon: Alexis Frock, MD;  Location: [facility];  Service: Urology;  Laterality: Left;  . WISDOM TOOTH EXTRACTION       Current Meds  Medication Sig  . aspirin EC 81 MG tablet Take 1 tablet (81 mg total) by mouth every morning.  Marland Kitchen atorvastatin (LIPITOR) 40 MG tablet Take 40 mg by mouth daily.  Marland Kitchen diltiazem (DILACOR XR) 240 MG 24 hr capsule Take 240 mg by mouth daily.  Marland Kitchen doxazosin (CARDURA) 4 MG tablet Take 8 mg by mouth daily.   Marland Kitchen EPIPEN 2-PAK 0.3 MG/0.3ML SOAJ Inject 0.3 mg into the skin as needed  (Allergic reaction).   . famotidine (PEPCID) 20 MG tablet Take 20 mg by mouth 2 (two) times daily.  . metFORMIN (GLUCOPHAGE-XR) 500 MG 24 hr tablet Take 500 mg by mouth 2 (two) times daily with a meal.  . metoprolol succinate (TOPROL-XL) 50 MG 24 hr tablet Take 50 mg by mouth daily. Take with or immediately following a meal.  . potassium chloride SA (K-DUR,KLOR-CON) 20 MEQ tablet Take 40 mEq by mouth daily.  Marland Kitchen telmisartan-hydrochlorothiazide (MICARDIS HCT) 80-12.5 MG tablet Take 1 tablet by mouth daily.  Marland Kitchen triamcinolone cream (KENALOG) 0.5 % Apply 1 application topically as directed. Arthritis in hand     Allergies:   Bee venom, Amlodipine, and Ace inhibitors   Social History   Tobacco Use  . Smoking status: Former Smoker    Types: Pipe, Cigars    Quit date: 10/21/2003    Years since quitting: 15.5  . Smokeless tobacco: Never Used  . Tobacco comment: ocassionally smokes a cigar  Substance Use Topics  . Alcohol use: No    Comment: Heavy durin g college years, Quit '99  . Drug use: No     Family Hx: The patient's family history includes Cancer in his mother; Diabetes in his mother; Hyperlipidemia in his father and mother; Hypertension in his father and mother.  ROS:   Please see the history of present illness.     All other systems reviewed and are negative.   Labs/Other Tests and Data Reviewed:    Recent Labs: No results found for requested labs within last 8760 hours.   Recent Lipid Panel No results found for: CHOL, TRIG, HDL, CHOLHDL, LDLCALC, LDLDIRECT  Wt Readings from Last 3 Encounters:  05/04/19 280 lb (127 kg)  01/20/18 287 lb 1.9 oz (130.2 kg)  09/15/16 281 lb (127.5 kg)     Objective:    Vital Signs:  BP (!) 177/97   Pulse 62   Ht 6' (1.829 m)   Wt 280 lb (127 kg)   BMI 37.97 kg/m    CONSTITUTIONAL:  Well nourished, well developed male in no acute distress.  EYES: anicteric MOUTH: oral mucosa is pink RESPIRATORY: Normal respiratory effort, symmetric  expansion CARDIOVASCULAR: No peripheral edema SKIN: No rash, lesions or ulcers MUSCULOSKELETAL: no digital cyanosis NEURO: Cranial Nerves II-XII grossly intact, moves all extremities PSYCH: Intact judgement and insight.  A&O x 3, Mood/affect appropriate   ASSESSMENT & PLAN:    1.  OSA - The patient is tolerating PAP therapy well without any problems. The PAP download was reviewed today and showed an AHI of 0.4/hr on 11 cm H2O with 100% compliance in using more than 4 hours nightly.  The patient has been using and benefiting from PAP use and will continue to benefit from therapy.   2.  HTN -he has been having problems with his BP being elevated -  suspect part of this is taking in Na in a seasoning that he was not aware of -he did not tolerate amlodipine due to LE edema -he is now on Midcardis HCT 80-25mg  daily, Doxazosin 8mg  daily, Cardizem CD 240mg  daily and Toprol XL 50mg  daily  -cannot increase BB or CCB further due to borderline bradycardia -I have recommended that we stop Micardis  HCT -start Benicar 40mg  daily and Chlorthalidone 25mg  daily -check BMET In 1 week -check renal duplex to rule out renal artery stenosis -24 hour urine for catecholamines, VMA, cortisol, dopamine, Metanephrines -Plasma renin activity to look for secondary causes of HTN -check BP daily for a week and call with results.  3.  Obesity -I have encuraged him to get into a routine exercise program and cut back on carbs and portions.     COVID-19 Education: The signs and symptoms of COVID-19 were discussed with the patient and how to seek care for testing (follow up with PCP or arrange E-visit).  The importance of social distancing was discussed today.  Patient Risk:   After full review of this patient's clinical status, I feel that they are at least moderate risk at this time.  Time:   Today, I have spent 20 minutes directly with the patient on telemedicine discussing medical problems including OSA< HTN,  Obesity.  We also reviewed the symptoms of COVID 19 and the ways to protect against contracting the virus with telehealth technology.  I spent an additional 5 minutes reviewing patient's chart including PAP compliance download.  Medication Adjustments/Labs and Tests Ordered: Current medicines are reviewed at length with the patient today.  Concerns regarding medicines are outlined above.  Tests Ordered: No orders of the defined types were placed in this encounter.  Medication Changes: No orders of the defined types were placed in this encounter.   Disposition:  Follow up in 1 year(s)  Signed, , MD  05/04/2019 12:55 PM    [facility]

## 2019-05-04 ENCOUNTER — Telehealth (INDEPENDENT_AMBULATORY_CARE_PROVIDER_SITE_OTHER): Payer: 59 | Admitting: Cardiology

## 2019-05-04 ENCOUNTER — Telehealth: Payer: Self-pay

## 2019-05-04 ENCOUNTER — Other Ambulatory Visit: Payer: Self-pay

## 2019-05-04 ENCOUNTER — Encounter: Payer: Self-pay | Admitting: Cardiology

## 2019-05-04 VITALS — BP 177/97 | HR 62 | Ht 72.0 in | Wt 280.0 lb

## 2019-05-04 DIAGNOSIS — I10 Essential (primary) hypertension: Secondary | ICD-10-CM

## 2019-05-04 DIAGNOSIS — G4733 Obstructive sleep apnea (adult) (pediatric): Secondary | ICD-10-CM

## 2019-05-04 MED ORDER — OLMESARTAN MEDOXOMIL 40 MG PO TABS: 40.0000 mg | ORAL_TABLET | Freq: Every day | ORAL | 3 refills | Status: DC

## 2019-05-04 MED ORDER — CHLORTHALIDONE 25 MG PO TABS: 25.0000 mg | ORAL_TABLET | Freq: Every day | ORAL | 3 refills | Status: DC

## 2019-05-04 NOTE — Patient Instructions (Signed)
Medication Instructions:  Your physician has recommended you make the following change in your medication:   1) STOP taking telmisartan-hydrochlorithiazide (Micardis) 2) START taking olmesartan (Benicar) 40 mg daily  3) START taking chlorthalidone (Hygroton) 25 mg daily   *If you need a refill on your cardiac medications before your next appointment, please call your pharmacy*  Lab Work: BMET, renin activity, and 24 hour urine If you have labs (blood work) drawn today and your tests are completely normal, you will receive your results only by: Marland Kitchen MyChart Message (if you have MyChart) OR . A paper copy in the mail If you have any lab test that is abnormal or we need to change your treatment, we will call you to review the results.  Testing/Procedures: Your physician has requested that you have a renal artery duplex. During this test, an ultrasound is used to evaluate blood flow to the kidneys. Allow one hour for this exam. Do not eat after midnight the day before and avoid carbonated beverages. Take your medications as you usually do.  Follow-Up: At [facility], you and your health needs are our priority.  As part of our continuing mission to provide you with exceptional heart care, we have created designated Provider Care Teams.  These Care Teams include your primary Cardiologist (physician) and Advanced Practice Providers (APPs -  Physician Assistants and Nurse Practitioners) who all work together to provide you with the care you need, when you need it.  Your next appointment:   6 week(s)  The format for your next appointment:   Virtual Visit   Provider:   Armanda Magic, MD  Other Instructions Please check your blood pressure daily for one week and send [place] your readings.   Low-Sodium Eating Plan Sodium, which is an element that makes up salt, helps you maintain a healthy balance of fluids in your body. Too much sodium can increase your blood pressure and cause fluid and waste to  be held in your body. Your health care provider or dietitian may recommend following this plan if you have high blood pressure (hypertension), kidney disease, liver disease, or heart failure. Eating less sodium can help lower your blood pressure, reduce swelling, and protect your heart, liver, and kidneys. What are tips for following this plan? General guidelines  Most people on this plan should limit their sodium intake to 1,500-2,000 mg (milligrams) of sodium each day. Reading food labels   The Nutrition Facts label lists the amount of sodium in one serving of the food. If you eat more than one serving, you must multiply the listed amount of sodium by the number of servings.  Choose foods with less than 140 mg of sodium per serving.  Avoid foods with 300 mg of sodium or more per serving. Shopping  Look for lower-sodium products, often labeled as "low-sodium" or "no salt added."  Always check the sodium content even if foods are labeled as "unsalted" or "no salt added".  Buy fresh foods. ? Avoid canned foods and premade or frozen meals. ? Avoid canned, cured, or processed meats  Buy breads that have less than 80 mg of sodium per slice. Cooking  Eat more home-cooked food and less restaurant, buffet, and fast food.  Avoid adding salt when cooking. Use salt-free seasonings or herbs instead of table salt or sea salt. Check with your health care provider or pharmacist before using salt substitutes.  Cook with plant-based oils, such as canola, sunflower, or olive oil. Meal planning  When eating at  a restaurant, ask that your food be prepared with less salt or no salt, if possible.  Avoid foods that contain MSG (monosodium glutamate). MSG is sometimes added to [place] food, bouillon, and some canned foods. What foods are recommended? The items listed may not be a complete list. Talk with your dietitian about what dietary choices are best for you. Grains Low-sodium cereals, including  oats, puffed wheat and rice, and shredded wheat. Low-sodium crackers. Unsalted rice. Unsalted pasta. Low-sodium bread. Whole-grain breads and whole-grain pasta. Vegetables Fresh or frozen vegetables. "No salt added" canned vegetables. "No salt added" tomato sauce and paste. Low-sodium or reduced-sodium tomato and vegetable juice. Fruits Fresh, frozen, or canned fruit. Fruit juice. Meats and other protein foods Fresh or frozen (no salt added) meat, poultry, seafood, and fish. Low-sodium canned tuna and salmon. Unsalted nuts. Dried peas, beans, and lentils without added salt. Unsalted canned beans. Eggs. Unsalted nut butters. Dairy Milk. Soy milk. Cheese that is naturally low in sodium, such as ricotta cheese, fresh mozzarella, or Swiss cheese Low-sodium or reduced-sodium cheese. Cream cheese. Yogurt. Fats and oils Unsalted butter. Unsalted margarine with no trans fat. Vegetable oils such as canola or olive oils. Seasonings and other foods Fresh and dried herbs and spices. Salt-free seasonings. Low-sodium mustard and ketchup. Sodium-free salad dressing. Sodium-free light mayonnaise. Fresh or refrigerated horseradish. Lemon juice. Vinegar. Homemade, reduced-sodium, or low-sodium soups. Unsalted popcorn and pretzels. Low-salt or salt-free chips. What foods are not recommended? The items listed may not be a complete list. Talk with your dietitian about what dietary choices are best for you. Grains Instant hot cereals. Bread stuffing, pancake, and biscuit mixes. Croutons. Seasoned rice or pasta mixes. Noodle soup cups. Boxed or frozen macaroni and cheese. Regular salted crackers. Self-rising flour. Vegetables Sauerkraut, pickled vegetables, and relishes. Olives. [place] fries. Onion rings. Regular canned vegetables (not low-sodium or reduced-sodium). Regular canned tomato sauce and paste (not low-sodium or reduced-sodium). Regular tomato and vegetable juice (not low-sodium or reduced-sodium). Frozen  vegetables in sauces. Meats and other protein foods Meat or fish that is salted, canned, smoked, spiced, or pickled. Bacon, ham, sausage, hotdogs, corned beef, chipped beef, packaged lunch meats, salt pork, jerky, pickled herring, anchovies, regular canned tuna, sardines, salted nuts. Dairy Processed cheese and cheese spreads. Cheese curds. Blue cheese. Feta cheese. String cheese. Regular cottage cheese. Buttermilk. Canned milk. Fats and oils Salted butter. Regular margarine. Ghee. Bacon fat. Seasonings and other foods Onion salt, garlic salt, seasoned salt, table salt, and sea salt. Canned and packaged gravies. Worcestershire sauce. Tartar sauce. Barbecue sauce. Teriyaki sauce. Soy sauce, including reduced-sodium. Steak sauce. Fish sauce. Oyster sauce. Cocktail sauce. Horseradish that you find on the shelf. Regular ketchup and mustard. Meat flavorings and tenderizers. Bouillon cubes. Hot sauce and Tabasco sauce. Premade or packaged marinades. Premade or packaged taco seasonings. Relishes. Regular salad dressings. Salsa. Potato and tortilla chips. Corn chips and puffs. Salted popcorn and pretzels. Canned or dried soups. Pizza. Frozen entrees and pot pies. Summary  Eating less sodium can help lower your blood pressure, reduce swelling, and protect your heart, liver, and kidneys.  Most people on this plan should limit their sodium intake to 1,500-2,000 mg (milligrams) of sodium each day.  Canned, boxed, and frozen foods are high in sodium. Restaurant foods, fast foods, and pizza are also very high in sodium. You also get sodium by adding salt to food.  Try to cook at home, eat more fresh fruits and vegetables, and eat less fast food, canned, processed, or prepared  foods. This information is not intended to replace advice given to you by your health care provider. Make sure you discuss any questions you have with your health care provider. Document Revised: 03/13/2017 Document Reviewed:  03/24/2016 Elsevier Patient Education  2020 [employer].

## 2019-05-04 NOTE — Telephone Encounter (Signed)
Left message for patient to call back.  Needs labs scheduled in 1 week.  Needs virtual visit with Dr. Mayford Knife in 6 weeks.  Needs renal ultrasound scheduled.

## 2019-05-09 ENCOUNTER — Ambulatory Visit (HOSPITAL_COMMUNITY)
Admission: RE | Admit: 2019-05-09 | Discharge: 2019-05-09 | Disposition: A | Discharge status: Home / Self Care | Payer: 59 | Source: Ambulatory Visit | Attending: Cardiology | Admitting: Cardiology

## 2019-05-09 ENCOUNTER — Other Ambulatory Visit: Payer: Self-pay

## 2019-05-09 DIAGNOSIS — I10 Essential (primary) hypertension: Secondary | ICD-10-CM | POA: Diagnosis not present

## 2019-05-09 DIAGNOSIS — G4733 Obstructive sleep apnea (adult) (pediatric): Secondary | ICD-10-CM | POA: Diagnosis not present

## 2019-05-11 ENCOUNTER — Other Ambulatory Visit: Payer: 59 | Admitting: *Deleted

## 2019-05-11 ENCOUNTER — Other Ambulatory Visit: Payer: Self-pay

## 2019-05-11 ENCOUNTER — Other Ambulatory Visit: Payer: Self-pay | Admitting: Cardiology

## 2019-05-11 DIAGNOSIS — I10 Essential (primary) hypertension: Secondary | ICD-10-CM

## 2019-05-11 DIAGNOSIS — G4733 Obstructive sleep apnea (adult) (pediatric): Secondary | ICD-10-CM

## 2019-05-12 ENCOUNTER — Telehealth: Payer: Self-pay

## 2019-05-12 ENCOUNTER — Telehealth: Payer: Self-pay | Admitting: Cardiology

## 2019-05-12 ENCOUNTER — Other Ambulatory Visit: Payer: Self-pay | Admitting: Cardiology

## 2019-05-12 DIAGNOSIS — Z79899 Other long term (current) drug therapy: Secondary | ICD-10-CM

## 2019-05-12 NOTE — Telephone Encounter (Signed)
Follow Up:      Returning your call from today. 

## 2019-05-12 NOTE — Telephone Encounter (Signed)
lpmtcb 1/28 

## 2019-05-12 NOTE — Telephone Encounter (Signed)
I spoke to the patient with Dr Norris Cross recommendation.  He will come in for BMET on 2/5.

## 2019-05-18 ENCOUNTER — Telehealth: Payer: Self-pay

## 2019-05-18 MED ORDER — POTASSIUM CHLORIDE CRYS ER 20 MEQ PO TBCR: 40.0000 meq | EXTENDED_RELEASE_TABLET | Freq: Two times a day (BID) | ORAL | 3 refills | Status: DC

## 2019-05-18 NOTE — Telephone Encounter (Signed)
Patient is aware to increase Kdur to 40 meq daily. Patient scheduled for lab work due to increase in chlorthalidone. Will move out a couple days so that we can recheck potassium as well.

## 2019-05-18 NOTE — Telephone Encounter (Signed)
-----   Message from Quintella Reichert, MD sent at 05/18/2019  1:50 PM EST ----- Increase Kdur to BID and repeat BMET in 1 week

## 2019-05-19 LAB — BASIC METABOLIC PANEL
BUN/Creatinine Ratio: 21 — ABNORMAL HIGH (ref 9–20)
BUN: 19 mg/dL (ref 6–24)
CO2: 22 mmol/L (ref 20–29)
Calcium: 10.1 mg/dL (ref 8.7–10.2)
Chloride: 99 mmol/L (ref 96–106)
Creatinine, Ser: 0.91 mg/dL (ref 0.76–1.27)
GFR calc Af Amer: 115 mL/min/{1.73_m2} (ref >59)
GFR calc non Af Amer: 99 mL/min/{1.73_m2} (ref >59)
Glucose: 122 mg/dL — ABNORMAL HIGH (ref 65–99)
Potassium: 3.4 mmol/L — ABNORMAL LOW (ref 3.5–5.2)
Sodium: 139 mmol/L (ref 134–144)

## 2019-05-19 LAB — ALDOSTERONE + RENIN ACTIVITY W/O RATIO
ALDOSTERONE: 14.9 ng/dL (ref 0.0–30.0)
Renin: 0.19 ng/mL/hr (ref 0.167–5.380)

## 2019-05-20 ENCOUNTER — Other Ambulatory Visit: Payer: 59

## 2019-05-21 LAB — CATECHOLAMINES, FRACTIONATED, URINE, 24 HOUR

## 2019-05-21 LAB — METANEPHRINES, URINE, 24 HOUR
Metaneph Total, Ur: 53 ug/L
Metanephrines, 24H Ur: 93 ug/24 hr (ref 58–276)
Normetanephrine, 24H Ur: 642 ug/24 hr (ref 156–729)
Normetanephrine, Ur: 367 ug/L

## 2019-05-21 LAB — VANILLYLMANDELIC ACID, 24-HR U
VMA, 24H Ur Adult: 8.1 mg/24 hr — ABNORMAL HIGH (ref 0.0–7.5)
VMA, Urine: 4.6 mg/L

## 2019-05-24 ENCOUNTER — Other Ambulatory Visit: Payer: 59

## 2019-05-24 ENCOUNTER — Other Ambulatory Visit: Payer: Self-pay

## 2019-05-24 DIAGNOSIS — Z79899 Other long term (current) drug therapy: Secondary | ICD-10-CM

## 2019-05-24 LAB — BASIC METABOLIC PANEL
BUN/Creatinine Ratio: 17 (ref 9–20)
BUN: 17 mg/dL (ref 6–24)
CO2: 23 mmol/L (ref 20–29)
Calcium: 9.5 mg/dL (ref 8.7–10.2)
Chloride: 100 mmol/L (ref 96–106)
Creatinine, Ser: 1 mg/dL (ref 0.76–1.27)
GFR calc Af Amer: 102 mL/min/{1.73_m2} (ref >59)
GFR calc non Af Amer: 89 mL/min/{1.73_m2} (ref >59)
Glucose: 164 mg/dL — ABNORMAL HIGH (ref 65–99)
Potassium: 3.2 mmol/L — ABNORMAL LOW (ref 3.5–5.2)
Sodium: 142 mmol/L (ref 134–144)

## 2019-05-25 ENCOUNTER — Other Ambulatory Visit: Payer: Self-pay

## 2019-05-25 ENCOUNTER — Telehealth: Payer: Self-pay

## 2019-05-25 DIAGNOSIS — Z79899 Other long term (current) drug therapy: Secondary | ICD-10-CM

## 2019-05-25 MED ORDER — POTASSIUM CHLORIDE CRYS ER 20 MEQ PO TBCR: 40.0000 meq | EXTENDED_RELEASE_TABLET | Freq: Three times a day (TID) | ORAL | 3 refills | Status: DC

## 2019-05-25 NOTE — Telephone Encounter (Signed)
-----   Message from Quintella Reichert, MD sent at 05/25/2019  4:15 PM EST ----- Hypokalemia due to diuretic but not much options for other BP meds as he is on max doses of several.  Please refer to HTN clinic. Increase Kdur to TID and repeat BMET on Friday

## 2019-05-25 NOTE — Progress Notes (Unsigned)
amb ref °

## 2019-05-27 ENCOUNTER — Other Ambulatory Visit: Payer: Self-pay

## 2019-05-27 ENCOUNTER — Other Ambulatory Visit: Payer: 59 | Admitting: *Deleted

## 2019-05-27 DIAGNOSIS — Z79899 Other long term (current) drug therapy: Secondary | ICD-10-CM

## 2019-05-27 LAB — BASIC METABOLIC PANEL
BUN/Creatinine Ratio: 20 (ref 9–20)
BUN: 18 mg/dL (ref 6–24)
CO2: 24 mmol/L (ref 20–29)
Calcium: 9.2 mg/dL (ref 8.7–10.2)
Chloride: 100 mmol/L (ref 96–106)
Creatinine, Ser: 0.91 mg/dL (ref 0.76–1.27)
GFR calc Af Amer: 115 mL/min/{1.73_m2} (ref >59)
GFR calc non Af Amer: 99 mL/min/{1.73_m2} (ref >59)
Glucose: 159 mg/dL — ABNORMAL HIGH (ref 65–99)
Potassium: 3.3 mmol/L — ABNORMAL LOW (ref 3.5–5.2)
Sodium: 138 mmol/L (ref 134–144)

## 2019-05-31 ENCOUNTER — Telehealth: Payer: Self-pay

## 2019-05-31 DIAGNOSIS — Z79899 Other long term (current) drug therapy: Secondary | ICD-10-CM

## 2019-05-31 MED ORDER — POTASSIUM CHLORIDE CRYS ER 20 MEQ PO TBCR: 40.0000 meq | EXTENDED_RELEASE_TABLET | Freq: Every day | ORAL | 3 refills | Status: DC

## 2019-05-31 NOTE — Telephone Encounter (Signed)
-----   Message from Quintella Reichert, MD sent at 05/31/2019 12:09 PM EST ----- Diuretic to stop is Chlorthalidone ----- Message ----- From: Theresia Majors, RN Sent: 05/31/2019  11:02 AM EST To: Quintella Reichert, MD  Patient has not been set up with renal.

## 2019-05-31 NOTE — Telephone Encounter (Signed)
Patient agrees to stop chlorthalidone and decrease potassium chloride  to 40 meq daily. He is going to call his urologist who prescribed this for him to make sure he is okay with this.  Patient is also scheduled in HTN clinic on Friday 02/19

## 2019-06-02 ENCOUNTER — Other Ambulatory Visit: Payer: 59

## 2019-06-03 ENCOUNTER — Ambulatory Visit: Payer: 59

## 2019-06-03 ENCOUNTER — Other Ambulatory Visit: Payer: 59 | Admitting: *Deleted

## 2019-06-03 ENCOUNTER — Other Ambulatory Visit: Payer: 59

## 2019-06-03 ENCOUNTER — Other Ambulatory Visit: Payer: Self-pay

## 2019-06-03 DIAGNOSIS — G4733 Obstructive sleep apnea (adult) (pediatric): Secondary | ICD-10-CM

## 2019-06-03 DIAGNOSIS — I10 Essential (primary) hypertension: Secondary | ICD-10-CM

## 2019-06-03 DIAGNOSIS — Z79899 Other long term (current) drug therapy: Secondary | ICD-10-CM

## 2019-06-03 NOTE — Progress Notes (Deleted)
Patient ID: Rodney Dominguez                 DOB: October 13, 1970                      MRN: [id number]     HPI: Rodney Dominguez is a 49 y.o. male referred by Dr. Radford Pax to HTN clinic. PMH is significant for OSAon CPAP, HTN, DM and obesity. Patient was seen by Dr. Radford Pax on 05/04/19 where he reported his BP was elevated. He has recently started using a seasoning that he did not realize had sodium in it. His Micardis/HCTZ was changed to Micardis and chlorthalidone 25mg  which was eventually increased to 50mg . However it was eventually stopped due to hypokalemia. Patient did have a renal artery duplex which was negative for renal artery stenosis. Aldosterone/ renin and metanephrines (urine) were WNL.VMA mildly elevated, catecholamines, fractionaed lab was canceled.    Dizziness, lightheadedness, headache, blurred vision, SOB, swelling New seasoning? Metoprolol to carvedilol Stop diltiazem and see if he could tolerate lower dose of amlodipine? Spironolactone? Hypokalemia a chronic issue?  Current HTN meds: diltiazem 240mg  daily, olmesartan 40mg  daily, metoprolol succinate 50mg  daily, doxazosin 8mg  daily Previously tried: amlodipine 10mg  (LLE), chlorthalidone (hypokalemia) HCTZ (changed to chlorthalidone) BP goal: <130/80  Family History: The patient's family history includes Cancer in his mother; Diabetes in his mother; Hyperlipidemia in his father and mother; Hypertension in his father and mother.  Social History: former smoker, no ETOH  Diet:   Exercise:   Home BP readings:   Wt Readings from Last 3 Encounters:  05/04/19 280 lb (127 kg)  01/20/18 287 lb 1.9 oz (130.2 kg)  09/15/16 281 lb (127.5 kg)   BP Readings from Last 3 Encounters:  05/04/19 (!) 177/97  01/20/18 124/90  07/09/17 120/65   Pulse Readings from Last 3 Encounters:  05/04/19 62  01/20/18 77  07/09/17 (!) 117    Renal function: CrCl cannot be calculated (Unknown ideal weight.).  Past Medical History:  Diagnosis  Date  . Diabetes mellitus without complication ([facility])    type II  . Hypercholesteremia   . Hypertension   . Kidney stone    left -first ever; surgery planned  . Obesity   . Sleep apnea    cpap- settings 11    Current Outpatient Medications on File Prior to Visit  Medication Sig Dispense Refill  . aspirin EC 81 MG tablet Take 1 tablet (81 mg total) by mouth every morning.    Marland Kitchen atorvastatin (LIPITOR) 40 MG tablet Take 40 mg by mouth daily.  1  . diltiazem (DILACOR XR) 240 MG 24 hr capsule Take 240 mg by mouth daily.    Marland Kitchen doxazosin (CARDURA) 4 MG tablet Take 8 mg by mouth daily.     Marland Kitchen EPIPEN 2-PAK 0.3 MG/0.3ML SOAJ Inject 0.3 mg into the skin as needed (Allergic reaction).     . famotidine (PEPCID) 20 MG tablet Take 20 mg by mouth 2 (two) times daily.    . metFORMIN (GLUCOPHAGE-XR) 500 MG 24 hr tablet Take 500 mg by mouth 2 (two) times daily with a meal.    . metoprolol succinate (TOPROL-XL) 50 MG 24 hr tablet Take 50 mg by mouth daily. Take with or immediately following a meal.    . olmesartan (BENICAR) 40 MG tablet Take 1 tablet (40 mg total) by mouth daily. 90 tablet 3  . potassium chloride SA (KLOR-CON) 20 MEQ tablet Take 2 tablets (40 mEq total) by mouth  daily. 180 tablet 3  . triamcinolone cream (KENALOG) 0.5 % Apply 1 application topically as directed. Arthritis in hand     No current facility-administered medications on file prior to visit.    Allergies  Allergen Reactions  . Bee Venom Anaphylaxis  . Amlodipine     LEG SWELLING  . Ace Inhibitors Cough    There were no vitals taken for this visit.   Assessment/Plan:  1. Hypertension -    Thank you  Olene Floss, Pharm.D, BCPS, CPP [facility]  [address], [place], [place] [address]  Phone: [phone]; Fax: [phone]

## 2019-06-04 LAB — BASIC METABOLIC PANEL
BUN/Creatinine Ratio: 22 — ABNORMAL HIGH (ref 9–20)
BUN: 20 mg/dL (ref 6–24)
CO2: 24 mmol/L (ref 20–29)
Calcium: 9.5 mg/dL (ref 8.7–10.2)
Chloride: 102 mmol/L (ref 96–106)
Creatinine, Ser: 0.91 mg/dL (ref 0.76–1.27)
GFR calc Af Amer: 115 mL/min/{1.73_m2} (ref >59)
GFR calc non Af Amer: 99 mL/min/{1.73_m2} (ref >59)
Glucose: 157 mg/dL — ABNORMAL HIGH (ref 65–99)
Potassium: 3.5 mmol/L (ref 3.5–5.2)
Sodium: 143 mmol/L (ref 134–144)

## 2019-06-06 ENCOUNTER — Telehealth: Payer: Self-pay

## 2019-06-06 DIAGNOSIS — Z79899 Other long term (current) drug therapy: Secondary | ICD-10-CM

## 2019-06-06 NOTE — Telephone Encounter (Signed)
-----   Message from Sigurd Sos, RN sent at 06/06/2019  8:30 AM EST -----  ----- Message ----- From: Quintella Reichert, MD Sent: 06/05/2019   7:17 PM EST To: Blair Heys, MD, [facility] Triage  K+ much improved after stopping diuretic.  Patient needs to let Urologist know that he should stay off diuretics (unless he can take a potassium sparing diuretic).  Please forward this to patient's urologist.  Please have patient stop K+ supp and repeat BMET on Wednesday 2/24

## 2019-06-07 ENCOUNTER — Other Ambulatory Visit: Payer: Self-pay

## 2019-06-07 ENCOUNTER — Ambulatory Visit (INDEPENDENT_AMBULATORY_CARE_PROVIDER_SITE_OTHER): Payer: 59 | Admitting: Pharmacist

## 2019-06-07 VITALS — BP 152/100 | HR 74

## 2019-06-07 DIAGNOSIS — I10 Essential (primary) hypertension: Secondary | ICD-10-CM

## 2019-06-07 MED ORDER — SPIRONOLACTONE 25 MG PO TABS: 25.0000 mg | ORAL_TABLET | Freq: Every day | ORAL | 3 refills | Status: DC

## 2019-06-07 NOTE — Patient Instructions (Addendum)
It was nice to meet you  Your blood pressure goal is less than 130/37mmHg  START taking spironolactone 25mg  - 1 tablet once daily in the morning Start your new HCTZ 12.5mg  - 1 tablet once daily in the morning as well  Continue taking: -Diltiazem 240mg  daily -Doxazosin 8mg  daily -Toprol 50mg  daily -Olmesartan 40mg  daily  Limit your sodium to < 2,000mg  daily  Follow up in 1 week for blood pressure check and lab work. Continue to monitor and record your blood pressure readings. Please bring these and your cuff to your next visit.

## 2019-06-07 NOTE — Progress Notes (Signed)
Patient ID: Rodney Dominguez                 DOB: 1971-02-03                      MRN: [id number]     HPI: Rodney Dominguez is a 49 y.o. male referred by Dr. Mayford Knife to HTN clinic. PMH is significant for OSA on CPAP, HTN, DM, HLD, and obesity. BP was 177/97 at last visit in January. Renal duplex was negative for renal artery stenosis. Renin and aldosterone levels were normal. Metanephrines normal, VMA mildly elevated, catecholamines were canceled. Chlorthalidone was increased to 50mg  daily, K dropped, K supplementation was increased, K remained low and ultimately chlorthalidone and K supplementation were discontinued. He presents today for follow up.  Pt presents today in good spirits. He just stopped his potassium supplement today - will move BMET scheduled for tomorrow. His urologist also started him on low dose HCTZ 12.5mg  daily for kidney stone prevention today. He has dealt with low potassium for a few years. Multiple med intolerances as noted below. Notes occasional dizziness if he stands up too quickly. Does take his doxazosin in the evening. Had headaches a few weeks ago when his BP was running higher (systolic in the 160s). In the past few weeks, BP has been 153/100, 144/92, 138/85. No NSAID use. Has tried to cut back on sodium in the past month or so.  Current HTN meds: diltiazem 240mg  daily, doxazosin 8mg  daily, Toprol 50mg  daily, olmesartan 40mg  daily Previously tried: amlodipine - LEE, chlorthalidone 50mg  - hypokalemia, ACEi - cough, spironolactone 25mg  daily (Dec 2020) - ineffective BP goal: <130/65mmHg  Family History: The patient's family history includes Cancer in his mother; Diabetes in his mother; Hyperlipidemia in his father and mother; Hypertension in his father and mother.  Social History: Former tobacco abuse, quit in 2005, occasionally smokes a cigar, prior alcohol use.  Diet: Fresh fruits, frozen vegetables, does order take out more with COVID, has tried to cut back on salt. 2  cups of coffee a day, soda (regular Coke or Dr ). Stopped using table salt, started using Mrs . Egg in AM, did have cheeseburger for lunch today.   Exercise: "walking Wednesdays" at work, now walks 3 days a week for 30-50 minutes (1 mile), sits at home for work -  Home BP readings: 153/100 144/92,138/85 in last week or two  Wt Readings from Last 3 Encounters:  05/04/19 280 lb (127 kg)  01/20/18 287 lb 1.9 oz (130.2 kg)  09/15/16 281 lb (127.5 kg)   BP Readings from Last 3 Encounters:  05/04/19 (!) 177/97  01/20/18 124/90  07/09/17 120/65   Pulse Readings from Last 3 Encounters:  05/04/19 62  01/20/18 77  07/09/17 (!) 117    Renal function: CrCl cannot be calculated (Unknown ideal weight.).  Past Medical History:  Diagnosis Date  . Diabetes mellitus without complication ([facility])    type II  . Hypercholesteremia   . Hypertension   . Kidney stone    left -first ever; surgery planned  . Obesity   . Sleep apnea    cpap- settings 11    Current Outpatient Medications on File Prior to Visit  Medication Sig Dispense Refill  . aspirin EC 81 MG tablet Take 1 tablet (81 mg total) by mouth every morning.    11/15/16 atorvastatin (LIPITOR) 40 MG tablet Take 40 mg by mouth daily.  1  . diltiazem (DILACOR XR) 240  MG 24 hr capsule Take 240 mg by mouth daily.    Marland Kitchen doxazosin (CARDURA) 4 MG tablet Take 8 mg by mouth daily.     Marland Kitchen EPIPEN 2-PAK 0.3 MG/0.3ML SOAJ Inject 0.3 mg into the skin as needed (Allergic reaction).     . famotidine (PEPCID) 20 MG tablet Take 20 mg by mouth 2 (two) times daily.    . metFORMIN (GLUCOPHAGE-XR) 500 MG 24 hr tablet Take 500 mg by mouth 2 (two) times daily with a meal.    . metoprolol succinate (TOPROL-XL) 50 MG 24 hr tablet Take 50 mg by mouth daily. Take with or immediately following a meal.    . olmesartan (BENICAR) 40 MG tablet Take 1 tablet (40 mg total) by mouth daily. 90 tablet 3  . triamcinolone cream (KENALOG) 0.5 % Apply 1  application topically as directed. Arthritis in hand     No current facility-administered medications on file prior to visit.    Allergies  Allergen Reactions  . Bee Venom Anaphylaxis  . Amlodipine     LEG SWELLING  . Ace Inhibitors Cough     Assessment/Plan:  1. Hypertension - BP remains elevated above goal <130/78mmHg. Pt's urologist started him on HCTZ 12.5mg  daily today for kidney stone protection (chlorthalidone 50mg  recently d/c'ed due to hypokalemia). Pt also stopped his K supplement today. Will continue to hold this but will also add spironolactone 25mg  daily. Hopefully will be able to further titrate spironolactone to bring BP closer to goal and keep K in range without needing K supplementation. Pt will continue on diltiazem 240mg  daily, doxazosin 8mg  daily, Toprol 50mg  daily, and olmesartan 40mg  daily. Repeat BMET on Monday with BP check. He will bring his home cuff and readings to this visit.   Kayson Tasker E. Halen Mossbarger, PharmD, BCACP, [place] [address], [place], [place] [address] Phone: [phone]; Fax: [phone] 06/07/2019 4:39 PM

## 2019-06-08 ENCOUNTER — Other Ambulatory Visit: Payer: 59

## 2019-06-12 NOTE — Progress Notes (Signed)
Patient ID: Rodney Dominguez                 DOB: 1970/11/13                      MRN: [id number]     HPI: Rodney Dominguez is a 49 y.o. male referred by Dr. Mayford Knife to HTN clinic. PMH is significant for OSA on CPAP, HTN, DM, HLD, and obesity. In January 2021, Renal duplex was negative for renal artery stenosis. Renin and aldosterone levels were normal. Metanephrines normal, VMA mildly elevated, catecholamines were canceled. Pt experienced hypokalemia on chlorthalidone 50mg  daily despite K supplementation, ultimately both meds were stopped. At HTN clinic visit last Tuesday, his urologist had just restarted him on low dose HCTZ for kidney stone prevention. His BP was still above goal at 152/100 mmHg, so he was started on spironolactone 25mg  daily to help with hypokalemia and elevated BP.  He presents today for follow-up and for BMET. Patient denies any dizziness, headaches, LEE, or muscle cramps since his medications were changed last week. He states that he took his morning medications today and brought his BP cuff to his visit. Based off measurements during visit today, home BP cuff seems to run 10 mmHg higher. He has had this BP cuff for over ten years and continues to measure his BP daily at home. He continues to follow the DASH diet and exercises by going outside.  Home cuff - 157/100, HR 71, recheck 159/99 Clinic cuff - 148/90, HR 76, recheck 144/94  Current HTN meds: diltiazem 240mg  daily (PM), doxazosin 8mg  daily (PM), Toprol 50mg  daily (PM), olmesartan 40mg  daily (AM), HCTZ 12.5mg  daily (AM), spironolactone 25mg  daily (AM)  Previously tried: amlodipine - LEE, chlorthalidone 50mg  - hypokalemia, ACEi - cough, spironolactone 25mg  daily (Dec 2020) - ineffective  BP goal: <130/80 mmHg  Family History: The patient'sfamily history includes Cancer in his mother; Diabetes in his mother; Hyperlipidemia in his father and mother; Hypertension in his father and mother.  Social History: Former tobacco  abuse, quit in 2005, occasionally smokes a cigar, prior alcohol use.  Diet: Fresh fruits, frozen vegetables, does order take out more with COVID, has tried to cut back on salt. 2 cups of coffee a day, soda (regular Coke or Dr ). Stopped using table salt, started using Mrs .  Exercise: "walking Wednesdays" at work, now walks 3 days a week for 30-50 minutes (1 mile), sits at home for work -  Home BP readings:  168/96, 166/99,  165/100, 164/93, 162/99, 154/98, HR in the low to mid 60s mainly, occasionally the 70s Avg 156/97, HR 66 Min 138/85, HR 57 Max 168/101, HR 87  Wt Readings from Last 3 Encounters:  05/04/19 280 lb (127 kg)  01/20/18 287 lb 1.9 oz (130.2 kg)  09/15/16 281 lb (127.5 kg)   BP Readings from Last 3 Encounters:  06/07/19 (!) 152/100  05/04/19 (!) 177/97  01/20/18 124/90   Pulse Readings from Last 3 Encounters:  06/07/19 74  05/04/19 62  01/20/18 77    Renal function: CrCl cannot be calculated (Unknown ideal weight.).  Past Medical History:  Diagnosis Date  . Diabetes mellitus without complication ([facility])    type II  . Hypercholesteremia   . Hypertension   . Kidney stone    left -first ever; surgery planned  . Obesity   . Sleep apnea    cpap- settings 11    Current Outpatient Medications on File Prior to  Visit  Medication Sig Dispense Refill  . aspirin EC 81 MG tablet Take 1 tablet (81 mg total) by mouth every morning.    Marland Kitchen atorvastatin (LIPITOR) 40 MG tablet Take 40 mg by mouth daily.  1  . diltiazem (DILACOR XR) 240 MG 24 hr capsule Take 240 mg by mouth daily.    Marland Kitchen doxazosin (CARDURA) 4 MG tablet Take 8 mg by mouth daily.     Marland Kitchen EPIPEN 2-PAK 0.3 MG/0.3ML SOAJ Inject 0.3 mg into the skin as needed (Allergic reaction).     . famotidine (PEPCID) 20 MG tablet Take 20 mg by mouth 2 (two) times daily.    . hydrochlorothiazide (MICROZIDE) 12.5 MG capsule Take 12.5 mg by mouth daily.    . metFORMIN (GLUCOPHAGE-XR) 500 MG 24 hr  tablet Take 500 mg by mouth 2 (two) times daily with a meal.    . metoprolol succinate (TOPROL-XL) 50 MG 24 hr tablet Take 50 mg by mouth daily. Take with or immediately following a meal.    . olmesartan (BENICAR) 40 MG tablet Take 1 tablet (40 mg total) by mouth daily. 90 tablet 3  . spironolactone (ALDACTONE) 25 MG tablet Take 1 tablet (25 mg total) by mouth daily. 30 tablet 3  . triamcinolone cream (KENALOG) 0.5 % Apply 1 application topically as directed. Arthritis in hand     No current facility-administered medications on file prior to visit.    Allergies  Allergen Reactions  . Bee Venom Anaphylaxis  . Amlodipine     LEG SWELLING  . Ace Inhibitors Cough     Assessment/Plan:  1. Hypertension - BP today is improved but remains above goal of <130/80 mmHg. Continue diltiazem 240mg  daily, doxazosin 8mg  daily, Toprol 50mg  daily, olmesartan 40mg  daily, HCTZ 12.5mg  daily, and spironolactone 25mg  daily. Pending the results of today's BMET, will increase spironolactone to 50mg  daily. Discussed the advantages of buying a new BP cuff and instructed to bring new cuff to next clinic visit. Will follow-up with medication changes and visit pending BMET results. Follow-up with Dr. Radford Pax virtually on 06/15/19.   Richardine Service, PharmD PGY1 Pharmacy Resident  [place]. Supp[place], PharmD, BCACP, [place] [address], [place], [place] [address] Phone: [phone]; Fax: [phone] 06/13/2019 3:14 PM

## 2019-06-13 ENCOUNTER — Other Ambulatory Visit: Payer: Self-pay

## 2019-06-13 ENCOUNTER — Ambulatory Visit: Payer: 59 | Admitting: Pharmacist

## 2019-06-13 VITALS — BP 144/94 | HR 75

## 2019-06-13 DIAGNOSIS — I10 Essential (primary) hypertension: Secondary | ICD-10-CM | POA: Diagnosis not present

## 2019-06-13 NOTE — Patient Instructions (Addendum)
Your blood pressure is improving and is closer to your goal is less than 130/8mmHg  If your labs are stable today, we will plan to increase your spironolactone to 50mg  daily. We will call you tomorrow to finalize this plan. For tonight, continue taking your current medications as usual  Look for a new bicep blood pressure cuff - Omron is a good brand

## 2019-06-14 ENCOUNTER — Telehealth: Payer: Self-pay | Admitting: Pharmacist

## 2019-06-14 LAB — CATECHOLAMINES, FRACTIONATED, URINE, 24 HOUR
Dopamine , 24H Ur: 260 ug/24 hr (ref 0–510)
Dopamine, Rand Ur: 137 ug/L
Epinephrine, 24H Ur: 2 ug/24 hr (ref 0–20)
Epinephrine, Rand Ur: 1 ug/L
Norepinephrine, 24H Ur: 82 ug/24 hr (ref 0–135)
Norepinephrine, Rand Ur: 43 ug/L

## 2019-06-14 LAB — BASIC METABOLIC PANEL
BUN/Creatinine Ratio: 26 — ABNORMAL HIGH (ref 9–20)
BUN: 20 mg/dL (ref 6–24)
CO2: 22 mmol/L (ref 20–29)
Calcium: 10.1 mg/dL (ref 8.7–10.2)
Chloride: 97 mmol/L (ref 96–106)
Creatinine, Ser: 0.78 mg/dL (ref 0.76–1.27)
GFR calc Af Amer: 123 mL/min/{1.73_m2} (ref >59)
GFR calc non Af Amer: 107 mL/min/{1.73_m2} (ref >59)
Glucose: 82 mg/dL (ref 65–99)
Potassium: 3.4 mmol/L — ABNORMAL LOW (ref 3.5–5.2)
Sodium: 137 mmol/L (ref 134–144)

## 2019-06-14 LAB — METANEPHRINES, URINE, 24 HOUR
Metaneph Total, Ur: 44 ug/L
Metanephrines, 24H Ur: 84 ug/24 hr (ref 58–276)
Normetanephrine, 24H Ur: 475 ug/24 hr (ref 156–729)
Normetanephrine, Ur: 250 ug/L

## 2019-06-14 LAB — VANILLYLMANDELIC ACID, 24-HR U
VMA, 24H Ur Adult: 6.5 mg/24 hr (ref 0.0–7.5)
VMA, Urine: 3.4 mg/L

## 2019-06-14 NOTE — Telephone Encounter (Addendum)
Called patient to discuss BMET results. Scr is stable, BUN increased slightly, and potassium was slightly low at 3.4. These labs reflect patient being on HCTZ for one week per urologist and the addition of spironolactone 25mg  one week ago. To better improve BP control and hypokalemia, will increase spironolactone to 50 mg daily. Instructed patient to take two of his 25 mg tablets until he runs out, advised him to call if he needs new rx sent in before follow up appt (otherwise will need new rx sent in at f/u). Encouraged him to continue to drink water and stay hydrated. Patient verbalized understanding. Will follow-up with office visit and recheck BMET on 06/29/19.

## 2019-06-14 NOTE — Progress Notes (Signed)
Virtual Visit via Telehpone Note   This visit type was conducted due to national recommendations for restrictions regarding the COVID-19 Pandemic (e.g. social distancing) in an effort to limit this patient's exposure and mitigate transmission in our community.  Due to his co-morbid illnesses, this patient is at least at moderate risk for complications without adequate follow up.  This format is felt to be most appropriate for this patient at this time.  All issues noted in this document were discussed and addressed.  A limited physical exam was performed with this format.  Please refer to the patient's chart for his consent to telehealth for [facility]   Evaluation Performed:  Follow-up visit  This visit type was conducted due to national recommendations for restrictions regarding the COVID-19 Pandemic (e.g. social distancing).  This format is felt to be most appropriate for this patient at this time.  All issues noted in this document were discussed and addressed.  No physical exam was performed (except for noted visual exam findings with Video Visits).  Please refer to the patient's chart (MyChart message for video visits and phone note for telephone visits) for the patient's consent to telehealth for [facility].  Date:  06/15/2019   ID:  Rodney Dominguez, DOB 01/12/1971, MRN [id number]  Patient Location:  Home  Provider location:   [place]  PCP:  Blair Heys, MD  Cardiologist:  Armanda Magic, MD Electrophysiologist:  None   Chief Complaint:  OSA, HTN  History of Present Illness:    Rodney Dominguez is a 49 y.o. male who presents via audio/video conferencing for a telehealth visit today.    Rodney Cohoonis a 48 y.o.malewith a hx of OSAon CPAP, HTN and obesity. When I last saw him he was having problems with elevated BPs.  He in intolerant to amlodipine due to LE edema and bradycardia has limited increased doses of BB or CCB.  Renal duplex showed on evidence of RAS and  24 hour urine catecholamines were normal.  PRA/Aldo/Renin were normal.  He also was placed on HCTZ due to nephrolithiasis but developed significant hypokalemia that was very difficult to replete.  The diuretic was stopped and hypokalemia improved. His Urologist placed him back on HCTZ and we added spiro 25mg  daily for HTN as well as to help prevent hypokalemia.   His BP has still been elevated and his was increased to 50mg  daily yesterday and he has a BMET and BP check ion 3/17.  He is here today for followup and is doing well.  He denies any chest pain or pressure, SOB, DOE, PND, orthopnea, LE edema, dizziness, palpitations or syncope. He is compliant with his meds and is tolerating meds with no SE.  He is doing well with his CPAP device and thinks that he has gotten used to it.  He tolerates the mask and feels the pressure is adequate.  Since going on CPAP he feels rested in the am and has no significant daytime sleepiness.  He denies any significant mouth or nasal dryness or nasal congestion.  He does not think that he snores.    The patient does not have symptoms concerning for COVID-19 infection (fever, chills, cough, or new shortness of breath).    Prior CV studies:   The following studies were reviewed today:  Labs  Past Medical History:  Diagnosis Date  . Diabetes mellitus without complication (HCC)    type II  . Hypercholesteremia   . Hypertension   . Kidney stone  left -first ever; surgery planned  . Obesity   . Sleep apnea    cpap- settings 11   Past Surgical History:  Procedure Laterality Date  . CYSTOSCOPY W/ RETROGRADES Left 10/28/2013   Procedure: CYSTOSCOPY WITH RETROGRADE PYELOGRAM LEFT URETEROSCOPY AND STENT PLACEMENT;  Surgeon: Sebastian Ache, MD;  Location: [facility];  Service: Urology;  Laterality: Left;  . NEPHROLITHOTOMY Left 10/28/2013   Procedure: NEPHROLITHOTOMY PERCUTANEOUS WITH ACCESS;  Surgeon: Sebastian Ache, MD;  Location: [facility];  Service: Urology;   Laterality: Left;  . WISDOM TOOTH EXTRACTION       Current Meds  Medication Sig  . aspirin EC 81 MG tablet Take 1 tablet (81 mg total) by mouth every morning.  Marland Kitchen atorvastatin (LIPITOR) 40 MG tablet Take 40 mg by mouth daily.  Marland Kitchen diltiazem (DILACOR XR) 240 MG 24 hr capsule Take 240 mg by mouth daily.  Marland Kitchen doxazosin (CARDURA) 4 MG tablet Take 8 mg by mouth daily.   Marland Kitchen EPIPEN 2-PAK 0.3 MG/0.3ML SOAJ Inject 0.3 mg into the skin as needed (Allergic reaction).   . famotidine (PEPCID) 20 MG tablet Take 20 mg by mouth 2 (two) times daily.  . hydrochlorothiazide (MICROZIDE) 12.5 MG capsule Take 12.5 mg by mouth daily.  . metFORMIN (GLUCOPHAGE-XR) 500 MG 24 hr tablet Take 1,000 mg by mouth daily with breakfast.   . metoprolol succinate (TOPROL-XL) 50 MG 24 hr tablet Take 50 mg by mouth daily. Take with or immediately following a meal.  . olmesartan (BENICAR) 40 MG tablet Take 1 tablet (40 mg total) by mouth daily.  Marland Kitchen spironolactone (ALDACTONE) 50 MG tablet Take 50 mg by mouth daily.  Marland Kitchen triamcinolone cream (KENALOG) 0.5 % Apply 1 application topically as directed. Arthritis in hand     Allergies:   Bee venom, Amlodipine, and Ace inhibitors   Social History   Tobacco Use  . Smoking status: Former Smoker    Types: Pipe, Cigars    Quit date: 10/21/2003    Years since quitting: 15.6  . Smokeless tobacco: Never Used  . Tobacco comment: ocassionally smokes a cigar  Substance Use Topics  . Alcohol use: No    Comment: Heavy durin g college years, Quit '99  . Drug use: No     Family Hx: The patient's family history includes Cancer in his mother; Diabetes in his mother; Hyperlipidemia in his father and mother; Hypertension in his father and mother.  ROS:   Please see the history of present illness.     All other systems reviewed and are negative.   Labs/Other Tests and Data Reviewed:    Recent Labs: 06/13/2019: BUN 20; Creatinine, Ser 0.78; Potassium 3.4; Sodium 137   Recent Lipid Panel No  results found for: CHOL, TRIG, HDL, CHOLHDL, LDLCALC, LDLDIRECT  Wt Readings from Last 3 Encounters:  06/15/19 280 lb (127 kg)  05/04/19 280 lb (127 kg)  01/20/18 287 lb 1.9 oz (130.2 kg)     Objective:    Vital Signs:  BP (!) 147/88   Pulse (!) 57   Ht 6' (1.829 m)   Wt 280 lb (127 kg)   BMI 37.97 kg/m     ASSESSMENT & PLAN:    1.  OSA -  The patient is tolerating PAP therapy well without any problems.   The patient has been using and benefiting from PAP use and will continue to benefit from therapy.   2.  HTN -BP borderline controlled on exam -continue HCTZ 12.5mg  daily, Diltiazem XR 240mg   daily, Arlyce Harman 50mg  daily, Olmesartan 40mg  daily, Toprol XL 50mg  daily and Doxazosin 8mg  daily -Arlyce Harman was just increased to 50mg  daily yesterday and has a repeat BMET and HTN clinic visit on 3/17  3.  Obesity -I have encouraged him to get into a routine exercise program and cut back on carbs and portions.    COVID-19 Education: The signs and symptoms of COVID-19 were discussed with the patient and how to seek care for testing (follow up with PCP or arrange E-visit).  The importance of social distancing was discussed today.  Patient Risk:   After full review of this patient's clinical status, I feel that they are at least moderate risk at this time.  Time:   Today, I have spent 20 minutes on telemedicine discussing medical problems including OSA, HTN, Obesity and reviewing patient's chart including outside labs from PCP on KPN  Medication Adjustments/Labs and Tests Ordered: Current medicines are reviewed at length with the patient today.  Concerns regarding medicines are outlined above.  Tests Ordered: No orders of the defined types were placed in this encounter.  Medication Changes: No orders of the defined types were placed in this encounter.   Disposition:  Follow up in 3 month(s)  Signed, Fransico Him, MD  06/15/2019 11:04 AM     [facility]

## 2019-06-15 ENCOUNTER — Encounter: Payer: Self-pay | Admitting: Cardiology

## 2019-06-15 ENCOUNTER — Telehealth (INDEPENDENT_AMBULATORY_CARE_PROVIDER_SITE_OTHER): Payer: 59 | Admitting: Cardiology

## 2019-06-15 ENCOUNTER — Other Ambulatory Visit: Payer: Self-pay

## 2019-06-15 VITALS — BP 147/88 | HR 57 | Ht 72.0 in | Wt 280.0 lb

## 2019-06-15 DIAGNOSIS — I10 Essential (primary) hypertension: Secondary | ICD-10-CM | POA: Diagnosis not present

## 2019-06-15 DIAGNOSIS — E669 Obesity, unspecified: Secondary | ICD-10-CM

## 2019-06-15 DIAGNOSIS — Z6837 Body mass index (BMI) 37.0-37.9, adult: Secondary | ICD-10-CM

## 2019-06-15 DIAGNOSIS — G4733 Obstructive sleep apnea (adult) (pediatric): Secondary | ICD-10-CM | POA: Diagnosis not present

## 2019-06-15 NOTE — Patient Instructions (Signed)
Medication Instructions:  Your physician recommends that you continue on your current medications as directed. Please refer to the Current Medication list given to you today.  *If you need a refill on your cardiac medications before your next appointment, please call your pharmacy*   Follow-Up: At [facility], you and your health needs are our priority.  As part of our continuing mission to provide you with exceptional heart care, we have created designated Provider Care Teams.  These Care Teams include your primary Cardiologist (physician) and Advanced Practice Providers (APPs -  Physician Assistants and Nurse Practitioners) who all work together to provide you with the care you need, when you need it.  We recommend signing up for the patient portal called "MyChart".  Sign up information is provided on this After Visit Summary.  MyChart is used to connect with patients for Virtual Visits (Telemedicine).  Patients are able to view lab/test results, encounter notes, upcoming appointments, etc.  Non-urgent messages can be sent to your provider as well.   To learn more about what you can do with MyChart, go to [email or web address].    Your next appointment:   3 month(s)  The format for your next appointment:   In Person  Provider:   Traci Turner, MD     

## 2019-06-29 ENCOUNTER — Other Ambulatory Visit: Payer: Self-pay

## 2019-06-29 ENCOUNTER — Ambulatory Visit (INDEPENDENT_AMBULATORY_CARE_PROVIDER_SITE_OTHER): Payer: 59 | Admitting: Pharmacist

## 2019-06-29 VITALS — BP 134/82 | HR 75

## 2019-06-29 DIAGNOSIS — I10 Essential (primary) hypertension: Secondary | ICD-10-CM | POA: Diagnosis not present

## 2019-06-29 MED ORDER — OLMESARTAN MEDOXOMIL 40 MG PO TABS: 40.0000 mg | ORAL_TABLET | Freq: Every day | ORAL | 3 refills | Status: DC

## 2019-06-29 MED ORDER — ATORVASTATIN CALCIUM 40 MG PO TABS: 40.0000 mg | ORAL_TABLET | Freq: Every day | ORAL | 3 refills | Status: AC

## 2019-06-29 MED ORDER — DILTIAZEM HCL ER 240 MG PO CP24: 240.0000 mg | ORAL_CAPSULE | Freq: Every day | ORAL | 3 refills | Status: DC

## 2019-06-29 MED ORDER — METOPROLOL SUCCINATE ER 50 MG PO TB24: 50.0000 mg | ORAL_TABLET | Freq: Every day | ORAL | 3 refills | Status: DC

## 2019-06-29 NOTE — Progress Notes (Signed)
Patient ID: Rodney Dominguez                 DOB: [birth year]                      MRN: [id number]     HPI: Rodney Dominguez is a 49 y.o. male referred by Dr. Mayford Knife to HTN clinic. PMH is significant for OSA on CPAP, HTN, DM, HLD, and obesity. In January 2021, Renal duplex was negative for renal artery stenosis. Renin and aldosterone levels were normal. Metanephrines normal, VMA mildly elevated, catecholamines were canceled. Pt experienced hypokalemia on chlorthalidone 50mg  daily despite K supplementation, ultimately both meds were stopped. At HTN clinic visit last Tuesday, his urologist had just restarted him on low dose HCTZ for kidney stone prevention. His BP was still above goal at 152/100 mmHg, so he was started on spironolactone 25mg  daily to help with hypokalemia and elevated BP. At last visit, K was 3.4 on spironolactone 25mg , HCTZ 12.5mg , and no K supplementation. Spironolactone dose was increased to 50mg  daily and pt presents today for follow up.  He presents today for follow-up and for BMET. Patient denies any dizziness, headaches, vision changes, or balance problems on higher dose of spironolactone. He purchased a new Omron bicep cuff since his old cuff was reading about Wednesday higher than clinic reading. No missed doses of medications, he has been staying active with walking and biking, and he is eating a low sodium diet.  He brings in very organized list of home readings: low 126/82, high 166/109, average 143/94, HR 60-70s. Checks his BP mid morning and mid afternoon.  Current HTN meds: diltiazem 240mg  daily (PM), doxazosin 8mg  daily (PM), Toprol 50mg  daily (PM), olmesartan 40mg  daily (AM), HCTZ 12.5mg  daily (AM), spironolactone 50mg  daily (AM)  Previously tried: amlodipine - LEE, chlorthalidone 50mg  - hypokalemia, ACEi - cough, spironolactone 25mg  daily (Dec 2020) - ineffective  BP goal: <130/80 mmHg  Family History: The patient'sfamily history includes Cancer in his mother; Diabetes in  his mother; Hyperlipidemia in his father and mother; Hypertension in his father and mother.  Social History: Former tobacco abuse, quit in 2005, occasionally smokes a cigar, prior alcohol use.  Diet: Fresh fruits, frozen vegetables, does order take out more with COVID, has tried to cut back on salt. 2 cups of coffee a day, soda (regular Coke or Dr ). Stopped using table salt, started using Mrs .  Exercise: "walking Wednesdays" at work, now walks 3 days a week for 30-50 minutes (1 mile), bike riding as well. sits at home for work -  Home BP readings: high 166/109, low 126/82, HR ranges 55-73. Most readings running 130-140s/80-90s.   Wt Readings from Last 3 Encounters:  06/15/19 280 lb (127 kg)  05/04/19 280 lb (127 kg)  01/20/18 287 lb 1.9 oz (130.2 kg)   BP Readings from Last 3 Encounters:  06/15/19 (!) 147/88  06/13/19 (!) 144/94  06/07/19 (!) 152/100   Pulse Readings from Last 3 Encounters:  06/15/19 (!) 57  06/13/19 75  06/07/19 74    Renal function: Estimated Creatinine Clearance: 155.6 mL/min (by C-G formula based on SCr of 0.78 mg/dL).  Past Medical History:  Diagnosis Date  . Diabetes mellitus without complication (HCC)    type II  . Hypercholesteremia   . Hypertension   . Kidney stone    left -first ever; surgery planned  . Obesity   . Sleep apnea    cpap- settings 11  Current Outpatient Medications on File Prior to Visit  Medication Sig Dispense Refill  . aspirin EC 81 MG tablet Take 1 tablet (81 mg total) by mouth every morning.    Marland Kitchen atorvastatin (LIPITOR) 40 MG tablet Take 40 mg by mouth daily.  1  . diltiazem (DILACOR XR) 240 MG 24 hr capsule Take 240 mg by mouth daily.    Marland Kitchen doxazosin (CARDURA) 4 MG tablet Take 8 mg by mouth daily.     Marland Kitchen EPIPEN 2-PAK 0.3 MG/0.3ML SOAJ Inject 0.3 mg into the skin as needed (Allergic reaction).     . famotidine (PEPCID) 20 MG tablet Take 20 mg by mouth 2 (two) times daily.    .  hydrochlorothiazide (MICROZIDE) 12.5 MG capsule Take 12.5 mg by mouth daily.    . metFORMIN (GLUCOPHAGE-XR) 500 MG 24 hr tablet Take 1,000 mg by mouth daily with breakfast.     . metoprolol succinate (TOPROL-XL) 50 MG 24 hr tablet Take 50 mg by mouth daily. Take with or immediately following a meal.    . olmesartan (BENICAR) 40 MG tablet Take 1 tablet (40 mg total) by mouth daily. 90 tablet 3  . spironolactone (ALDACTONE) 50 MG tablet Take 50 mg by mouth daily.    Marland Kitchen triamcinolone cream (KENALOG) 0.5 % Apply 1 application topically as directed. Arthritis in hand     No current facility-administered medications on file prior to visit.    Allergies  Allergen Reactions  . Bee Venom Anaphylaxis  . Amlodipine     LEG SWELLING  . Ace Inhibitors Cough     Assessment/Plan:  1. Hypertension - BP today has improved notably and is close to goal <130/80 mmHg. Continue diltiazem 240mg  daily, doxazosin 8mg  daily, Toprol 50mg  daily, olmesartan 40mg  daily, and HCTZ 12.5mg  daily. Pending BMET results today, will increase spironolactone to 100mg  daily. Encouraged pt to continue DASH diet and physical activity. Will schedule follow up pending lab results and medication changes.  Ranya Fiddler E. Deion Swift, PharmD, BCACP, [facility] [address], [place], [place] [address] Phone: [phone]; Fax: [phone] 06/29/2019 7:27 AM

## 2019-06-29 NOTE — Patient Instructions (Addendum)
It was nice to see you today!  Your blood pressure goal is < 130/73mmHg  If your labs are stable, we will increase your spironolactone to 100mg  daily. I will give you a call tomorrow to discuss your results and schedule follow up  Continue taking your other medications

## 2019-06-30 ENCOUNTER — Telehealth: Payer: Self-pay | Admitting: Pharmacist

## 2019-06-30 LAB — BASIC METABOLIC PANEL WITH GFR
BUN/Creatinine Ratio: 16 (ref 9–20)
BUN: 16 mg/dL (ref 6–24)
CO2: 24 mmol/L (ref 20–29)
Calcium: 9.6 mg/dL (ref 8.7–10.2)
Chloride: 103 mmol/L (ref 96–106)
Creatinine, Ser: 0.97 mg/dL (ref 0.76–1.27)
GFR calc Af Amer: 106 mL/min/1.73
GFR calc non Af Amer: 92 mL/min/1.73
Glucose: 120 mg/dL — ABNORMAL HIGH (ref 65–99)
Potassium: 4.1 mmol/L (ref 3.5–5.2)
Sodium: 140 mmol/L (ref 134–144)

## 2019-06-30 MED ORDER — SPIRONOLACTONE 100 MG PO TABS: 100.0000 mg | ORAL_TABLET | Freq: Every day | ORAL | 3 refills | Status: DC

## 2019-06-30 NOTE — Telephone Encounter (Addendum)
Called pt discuss lab results. BMET results stable, K has improved with dose titration of spironolactone (3.4 >> 4.1). Will increase spironolactone to 100mg  daily for further BP lowering and follow up in 3 weeks for BP check. New rx has been sent in, pt aware of plan.

## 2019-06-30 NOTE — Telephone Encounter (Signed)
Thank you so much

## 2019-07-25 ENCOUNTER — Other Ambulatory Visit: Payer: Self-pay | Admitting: Cardiology

## 2019-07-25 ENCOUNTER — Other Ambulatory Visit: Payer: Self-pay

## 2019-07-25 ENCOUNTER — Ambulatory Visit (INDEPENDENT_AMBULATORY_CARE_PROVIDER_SITE_OTHER): Payer: 59 | Admitting: Pharmacist

## 2019-07-25 VITALS — BP 100/62 | HR 60

## 2019-07-25 DIAGNOSIS — I10 Essential (primary) hypertension: Secondary | ICD-10-CM

## 2019-07-25 LAB — BASIC METABOLIC PANEL
BUN/Creatinine Ratio: 21 — ABNORMAL HIGH (ref 9–20)
BUN: 26 mg/dL — ABNORMAL HIGH (ref 6–24)
CO2: 21 mmol/L (ref 20–29)
Calcium: 9.7 mg/dL (ref 8.7–10.2)
Chloride: 101 mmol/L (ref 96–106)
Creatinine, Ser: 1.22 mg/dL (ref 0.76–1.27)
GFR calc Af Amer: 81 mL/min/{1.73_m2} (ref >59)
GFR calc non Af Amer: 70 mL/min/{1.73_m2} (ref >59)
Glucose: 127 mg/dL — ABNORMAL HIGH (ref 65–99)
Potassium: 4.6 mmol/L (ref 3.5–5.2)
Sodium: 137 mmol/L (ref 134–144)

## 2019-07-25 NOTE — Progress Notes (Signed)
Patient ID: [facility]                 DOB: 11-09-1970                      MRN: [id number]     HPI: Rodney Dominguez is a 49 y.o. male referred by Dr. Radford Pax to HTN clinic. PMH is significant for OSA on CPAP, HTN, DM, HLD, and obesity. In January 2021, Renal duplex was negative for renal artery stenosis. Renin and aldosterone levels were normal. Metanephrines normal, VMA mildly elevated, catecholamines were canceled. Pt experienced hypokalemia on chlorthalidone 50mg  daily despite K supplementation, ultimately both meds were stopped. Recently, his urologist restarted him on low dose HCTZ for kidney stone prevention. His BP was still above goal at 152/100 mmHg, so he was started on spironolactone 25mg  daily to help with hypokalemia and elevated BP. At previous visit, K was 4.1 on spironolactone 50mg , HCTZ 12.5mg , and no K supplementation. Spironolactone dose was increased to 100mg  daily and pt presents today for follow up.  He presents today for follow-up and for BMET. Patient reports dizziness, exercise intolerance, fatigue, and orthostasis which have started to occur in the last 3 weeks after the increase to higher dose of spironolactone. Patient reports successfully checking blood pressures at home on Omron machine. No missed doses of medications, he has been staying active with walking and biking, and he is eating a low sodium diet. Patient has significantly reduced take out and fast food meals from diet. He has also reduced his soda and coffee intake.  He brings in very organized list of home readings: low 107/63, high 143/95, average BP at goal, HR 60s. Checks his BP mid morning and mid afternoon. BP in clinic today was 100/62, pulse 60. Patient's home BP also demonstrated appropriate use of his home BP monitoring device, which yielded a BP of 106/71 and a pulse of 61.  Current HTN meds: diltiazem 240mg  daily (PM), doxazosin 8mg  daily (PM), Toprol 50mg  daily (PM), olmesartan 40mg  daily (AM), HCTZ  12.5mg  daily (AM), spironolactone 100mg  daily (AM)  Previously tried: amlodipine - LEE, chlorthalidone 50mg  - hypokalemia, ACEi - cough, spironolactone 25mg  daily (Dec 2020) - ineffective  BP goal: <130/80 mmHg  Family History: The patient'sfamily history includes Cancer in his mother; Diabetes in his mother; Hyperlipidemia in his father and mother; Hypertension in his father and mother.  Social History: Former tobacco abuse, quit in 2005, occasionally smokes a cigar, prior alcohol use.  Diet: Fresh fruits, frozen vegetables, not ordering take out and does not eat fast food regularly, has tried to cut back on salt. 1 cup of coffee a day (down from 2 cups), Does not drink soda much anymore, only about once a month. Stopped using table salt, started using Mrs Deliah Boston.   Exercise: "walking Wednesdays" at work, walks 2-3 days a week for 25-50 minutes (1 mile), bike riding as well. 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  Past Medical History:  Diagnosis Date  . Diabetes mellitus without complication (HCC)    type II  . Hypercholesteremia   . Hypertension   . Kidney stone    left -first ever; surgery planned  . Obesity   . Sleep apnea    cpap- settings 11    Current Outpatient Medications on File Prior to Visit  Medication Sig Dispense Refill  . aspirin EC 81 MG tablet Take 1 tablet (81 mg total) by mouth every morning.    Marland Kitchen atorvastatin (LIPITOR) 40 MG tablet Take 1 tablet (40 mg total) by mouth daily. 90 tablet 3  . diltiazem (DILACOR XR) 240 MG 24  hr capsule Take 1 capsule (240 mg total) by mouth daily. 90 capsule 3  . doxazosin (CARDURA) 4 MG tablet Take 8 mg by mouth daily.     Marland Kitchen EPIPEN 2-PAK 0.3 MG/0.3ML SOAJ Inject 0.3 mg into the skin as needed (Allergic reaction).     . famotidine (PEPCID) 20 MG tablet Take 20 mg by mouth 2 (two) times daily.    . hydrochlorothiazide (MICROZIDE) 12.5 MG capsule Take 12.5 mg by mouth daily.    . metFORMIN (GLUCOPHAGE-XR) 500 MG 24 hr tablet Take 1,000 mg by mouth daily with breakfast.     . metoprolol succinate (TOPROL-XL) 50 MG 24 hr tablet Take 1 tablet (50 mg total) by mouth daily. Take with or immediately following a meal. 90 tablet 3  . olmesartan (BENICAR) 40 MG tablet Take 1 tablet (40 mg total) by mouth daily. 90 tablet 3  . spironolactone (ALDACTONE) 100 MG tablet Take 1 tablet (100 mg total) by mouth daily. 90 tablet 3  . triamcinolone cream (KENALOG) 0.5 % Apply 1 application topically as directed. Arthritis in hand     No current facility-administered medications on file prior to visit.    Allergies  Allergen Reactions  . Bee Venom Anaphylaxis  . Amlodipine     LEG SWELLING  . Ace Inhibitors Cough     Assessment/Plan:  1. Hypertension - BP today has improved notably and is at goal of <130/80 mmHg. However, patient reports adverse effects such as exercise intolerance, orthostasis, and fatigue which could be due to metoprolol. Reduce dose of Toprol XL to 25 mg daily. Continue diltiazem 240mg  daily, doxazosin 8mg  daily, olmesartan 40mg  daily, spironolactone 100 mg daily and HCTZ 12.5mg  daily. Obtain BMET results today to ensure K+ and Scr are stable with recent dose increase of spironolactone. Encouraged pt to continue DASH diet and physical activity.   Scheduled in-person follow up in 3 weeks.  , PharmD PGY1 Acute Care Pharmacy Resident [facility] [address] [place], [facility] [address] Phone: [phone]; Fax: [phone] 07/25/2019  8:09 AM

## 2019-07-25 NOTE — Patient Instructions (Addendum)
It was great seeing you today!   Continue taking diltiazem 240mg  daily (PM), doxazosin 8mg  daily (PM), olmesartan 40mg  daily (AM), HCTZ 12.5mg  daily (AM), and spironolactone 100mg  daily (AM).  Decrease dose of Toprol to 25 mg daily (PM). You can cut your current 50 mg tablets in half and take 25 mg.  We will call you when lab work results.  Recheck BP in 3 weeks in the clinic.  Have a nice day!

## 2019-08-14 NOTE — Progress Notes (Signed)
Patient ID: Rodney Dominguez                 DOB: 08/07/1970                      MRN: [id number]     HPI: Rodney Dominguez is a 49 y.o. male referred by Dr. Radford Pax to HTN clinic. PMH is significant for OSA on CPAP, HTN, DM, HLD, and obesity. In January 2021, Renal duplex was negative for renal artery stenosis. Renin and aldosterone levels were normal. Metanephrines normal, VMA mildly elevated, catecholamines were canceled. Pt experienced hypokalemia on chlorthalidone 50mg  daily despite K supplementation, ultimately both meds were stopped. Recently, his urologist restarted him on low dose HCTZ for kidney stone prevention. His BP was still above goal at 152/100 mmHg, therefore patient was referred to HTN clinic. Rodney Dominguez has since had dose increase in spironolactone to 100mg  daily. At the previous visit, metoprolol succinate was decreased from 50 mg daily to 25 mg daily since patient was experiencing hypotension, dizziness, exercise intolerance, fatigue, and orthostasis.   Rodney Dominguez reports that fatigue, dizziness, and exercise intolerance have all improved since the past visit. He is still experiencing some fatigue and shortness of breath from time to time but it is tolerance and has been improving. Patient has been staying active with walking and biking. Patient reports being in the mountains over the weekend and tolerated rock climbing. He is eating a low sodium diet. Patient has significantly reduced take out and fast food meals from diet. He has also reduced his soda and coffee intake.  He brings in very organized list of home readings: low 95/62, high 130/91, average BP 110/73 at goal, HR 60s. Low value of 95/62 was day after last clinic visit, where his higher dose of metoprolol might still be washing out. Pt checks his BP mid morning and evening. BP in clinic today was 108/70, pulse 68.  Bmet results from last visit continue to show a mild increase in serum creatinine. 2 months ago, Scr was 0.78, 1  month ago it was 0.97, and 3 weeks ago, the Scr was 1.22. Potassium remains stable at 4.6.  Current HTN meds: diltiazem 240mg  daily (PM), doxazosin 8mg  daily (PM), Toprol 25mg  daily (PM), olmesartan 40mg  daily (AM), HCTZ 12.5mg  daily (AM), spironolactone 100mg  daily (AM)  Previously tried: amlodipine - LEE, chlorthalidone 50mg  - hypokalemia, ACEi - cough, spironolactone 25mg  daily (Dec 2020) - ineffective  BP goal: <130/80 mmHg  Family History: The patient'sfamily history includes Cancer in his mother; Diabetes in his mother; Hyperlipidemia in his father and mother; Hypertension in his father and mother.  Social History: Former tobacco abuse, quit in 2005, occasionally smokes a cigar, prior alcohol use.  Diet: Fresh fruits, frozen vegetables, not ordering take out and does not eat fast food regularly, has tried to cut back on salt. Switched from coffee to drinking 1 cup of caffeinated/decaffenated tea, Does not drink soda much anymore, only about once a month. Stopped using table salt, started using Mrs Deliah Boston.   Exercise: "walking Wednesdays" at work, walks 2-3 days a week for 25-50 minutes (2 miles), bike riding as well, went mountaining this weekend. 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  Past Medical History:  Diagnosis Date  . Diabetes mellitus without complication (HCC)    type II  . Hypercholesteremia   . Hypertension   . Kidney stone    left -first ever; surgery planned  . Obesity   . Sleep apnea    cpap- settings 11    Current Outpatient Medications on File Prior to Visit  Medication Sig  Dispense Refill  . aspirin EC 81 MG tablet Take 1 tablet (81 mg total) by mouth every morning.    Marland Kitchen atorvastatin (LIPITOR) 40 MG tablet Take 1 tablet (40 mg total) by mouth daily. 90 tablet 3  . diltiazem (DILACOR XR) 240 MG 24 hr capsule Take 1 capsule (240 mg total) by mouth daily. 90 capsule 3  . doxazosin (CARDURA) 4 MG tablet Take 8 mg by mouth daily.     Marland Kitchen EPIPEN 2-PAK 0.3 MG/0.3ML SOAJ Inject 0.3 mg into the skin as needed (Allergic reaction).     . famotidine (PEPCID) 20 MG tablet Take 20 mg by mouth 2 (two) times daily.    . hydrochlorothiazide (MICROZIDE) 12.5 MG capsule Take 12.5 mg by mouth daily.    . metFORMIN (GLUCOPHAGE-XR) 500 MG 24 hr tablet Take 1,000 mg by mouth daily with breakfast.     . metoprolol succinate (TOPROL-XL) 50 MG 24 hr tablet Take 0.5 tablets (25 mg total) by mouth daily. Take with or immediately following a meal.    . olmesartan (BENICAR) 40 MG tablet Take 1 tablet (40 mg total) by mouth daily. 90 tablet 3  . spironolactone (ALDACTONE) 100 MG tablet Take 1 tablet (100 mg total) by mouth daily. 90 tablet 3  . triamcinolone cream (KENALOG) 0.5 % Apply 1 application topically as directed. Arthritis in hand     No current facility-administered medications on file prior to visit.    Allergies  Allergen Reactions  . Bee Venom Anaphylaxis  . Amlodipine     LEG SWELLING  . Ace Inhibitors Cough     Assessment/Plan:  1. Hypertension - BP has improved and is at goal of <130/80 mmHg. Maximum blood pressure reading (130/91) is close to goal. Continue diltiazem 240mg  daily, metoprolol succinate 25mg  daily, doxazosin 8mg  daily, olmesartan 40mg  daily, spironolactone 100 mg daily and HCTZ 12.5mg  daily. Encouraged pt to continue DASH diet and physical activity. If fatigue and dizziness side effects from metoprolol do not completely resolve, we discussed that it may be possible to discontinue metoprolol altogether in the future. Increase in Scr from 0.97 to 1.22. We will  check a repeat BMET today to assess if renal function is returning to baseline. BMET will also help recheck K+ since patient is on high dose of spironolactone.  No scheduled follow-up but patient instructed to call with any concerns or questions at [phone]   , PharmD PGY1 Acute Care Pharmacy Resident [facility] [address] [place], [place] [facility] Phone: [phone]; Fax: [phone] 08/14/2019 10:15 PM

## 2019-08-15 ENCOUNTER — Other Ambulatory Visit: Payer: Self-pay

## 2019-08-15 ENCOUNTER — Ambulatory Visit (INDEPENDENT_AMBULATORY_CARE_PROVIDER_SITE_OTHER): Payer: 59 | Admitting: Pharmacist

## 2019-08-15 VITALS — BP 108/70 | HR 68

## 2019-08-15 DIAGNOSIS — I10 Essential (primary) hypertension: Secondary | ICD-10-CM

## 2019-08-15 LAB — BASIC METABOLIC PANEL
BUN/Creatinine Ratio: 18 (ref 9–20)
BUN: 19 mg/dL (ref 6–24)
CO2: 20 mmol/L (ref 20–29)
Calcium: 9.5 mg/dL (ref 8.7–10.2)
Chloride: 104 mmol/L (ref 96–106)
Creatinine, Ser: 1.03 mg/dL (ref 0.76–1.27)
GFR calc Af Amer: 99 mL/min/{1.73_m2} (ref >59)
GFR calc non Af Amer: 85 mL/min/{1.73_m2} (ref >59)
Glucose: 143 mg/dL — ABNORMAL HIGH (ref 65–99)
Potassium: 4.2 mmol/L (ref 3.5–5.2)
Sodium: 137 mmol/L (ref 134–144)

## 2019-08-15 NOTE — Patient Instructions (Addendum)
It was great seeing you today!  Your average blood pressure over the past two weeks has been 110/73, which is at goal of <130/80! Great work! Continue to implement dietary modifications and increasing exercise as tolerable. Some of the side effects from metoprolol such as dizziness, fatigue will continue to improve over time. If these side effects worsen, we may be able to make changes to your medications.  Please call us if you have any questions! [phone]

## 2019-09-06 ENCOUNTER — Telehealth: Payer: Self-pay | Admitting: Pharmacist

## 2019-09-06 NOTE — Telephone Encounter (Signed)
Called pt to follow up with BP readings. Reports readings are all still at goal, still noticing some fatigue. Will stop Toprol 25mg  daily as pt does not have an indication for beta blocker therapy (he believes his PCP started him on Toprol a while ago). He will continue to monitor his BP at home and I'll give him a call in another 3 weeks to ensure BP remains at goal and see if fatigue has improved.

## 2019-09-15 ENCOUNTER — Ambulatory Visit: Payer: 59 | Admitting: Cardiology

## 2019-09-25 NOTE — Progress Notes (Addendum)
Virtual Visit via Video Note   This visit type was conducted due to national recommendations for restrictions regarding the COVID-19 Pandemic (e.g. social distancing) in an effort to limit this patient's exposure and mitigate transmission in our community.  Due to his co-morbid illnesses, this patient is at least at moderate risk for complications without adequate follow up.  This format is felt to be most appropriate for this patient at this time.  All issues noted in this document were discussed and addressed.  A limited physical exam was performed with this format.  Please refer to the patient's chart for his consent to telehealth for [facility].  Evaluation Performed:  Follow-up visit  This visit type was conducted due to national recommendations for restrictions regarding the COVID-19 Pandemic (e.g. social distancing).  This format is felt to be most appropriate for this patient at this time.  All issues noted in this document were discussed and addressed.  No physical exam was performed (except for noted visual exam findings with Video Visits).  Please refer to the patient's chart (MyChart message for video visits and phone note for telephone visits) for the patient's consent to telehealth for [facility].  Date:  09/26/2019   ID:  Rodney Dominguez, DOB 10-May-1970, MRN [id number]  Patient Location:  Home  Provider location:   [place]  PCP:  Gaynelle Arabian, MD  Sleep Medicine:  Fransico Him, MD Electrophysiologist:  None   Chief Complaint:  OSA, HTN  History of Present Illness:    Rodney Dominguez is a 49 y.o. male who presents via audio/video conferencing for a telehealth visit today.    Rodney Cohoonis a 49 y.o.malewith a hx of OSAon CPAP, HTN and obesity.  He in intolerant to amlodipine due to LE edema and bradycardia has limited increased doses of BB or CCB.  Renal duplex showed on evidence of RAS and 24 hour urine catecholamines were normal.  PRA/Aldo/Renin were  normal.  He also was placed on HCTZ due to nephrolithiasis but developed significant hypokalemia that was very difficult to replete.  The diuretic was stopped and hypokalemia improved. His Urologist placed him back on HCTZ and spiro 25mg  daily for HTN was added as well as to help prevent hypokalemia.   Toprol was subsequently stopped due to fatigue.   He is here today for followup and is doing well.  He denies any chest pain or pressure, SOB, DOE, PND, orthopnea, LE edema, dizziness, palpitations or syncope. He is compliant with his meds and is tolerating meds with no SE.  He is doing well with his CPAP device and thinks that he has gotten used to it.  He tolerates the mask and feels the pressure is adequate.  Since going on CPAP he feels rested in the am and has no significant daytime sleepiness.  He denies any significant mouth or nasal dryness or nasal congestion.  He does not think that he snores.  He is complaining of fatigue that has occurred over the past year.  He has no DOE or CP but gets fatigued when he is out riding a bike or trying to be active.  He says that he has not really gained much weight during COVID.    The patient does not have symptoms concerning for COVID-19 infection (fever, chills, cough, or new shortness of breath).   Prior CV studies:   The following studies were reviewed today:  PAP compliance download  Past Medical History:  Diagnosis Date  . Diabetes mellitus without complication (  HCC)    type II  . Hypercholesteremia   . Hypertension   . Kidney stone    left -first ever; surgery planned  . Obesity   . Sleep apnea    cpap- settings 11   Past Surgical History:  Procedure Laterality Date  . CYSTOSCOPY W/ RETROGRADES Left 10/28/2013   Procedure: CYSTOSCOPY WITH RETROGRADE PYELOGRAM LEFT URETEROSCOPY AND STENT PLACEMENT;  Surgeon: Sebastian Ache, MD;  Location: [facility];  Service: Urology;  Laterality: Left;  . NEPHROLITHOTOMY Left 10/28/2013   Procedure:  NEPHROLITHOTOMY PERCUTANEOUS WITH ACCESS;  Surgeon: Sebastian Ache, MD;  Location: [facility];  Service: Urology;  Laterality: Left;  . WISDOM TOOTH EXTRACTION       Current Meds  Medication Sig  . aspirin EC 81 MG tablet Take 1 tablet (81 mg total) by mouth every morning.  Marland Kitchen atorvastatin (LIPITOR) 40 MG tablet Take 1 tablet (40 mg total) by mouth daily.  Marland Kitchen diltiazem (DILACOR XR) 240 MG 24 hr capsule Take 1 capsule (240 mg total) by mouth daily.  Marland Kitchen doxazosin (CARDURA) 4 MG tablet Take 8 mg by mouth daily.   Marland Kitchen EPIPEN 2-PAK 0.3 MG/0.3ML SOAJ Inject 0.3 mg into the skin as needed (Allergic reaction).   . famotidine (PEPCID) 20 MG tablet Take 20 mg by mouth 2 (two) times daily.  . hydrochlorothiazide (MICROZIDE) 12.5 MG capsule Take 12.5 mg by mouth daily.  . metFORMIN (GLUCOPHAGE-XR) 500 MG 24 hr tablet Take 1,000 mg by mouth daily with breakfast.   . olmesartan (BENICAR) 40 MG tablet Take 1 tablet (40 mg total) by mouth daily.  Marland Kitchen spironolactone (ALDACTONE) 100 MG tablet Take 1 tablet (100 mg total) by mouth daily.  Marland Kitchen triamcinolone cream (KENALOG) 0.5 % Apply 1 application topically as needed. Arthritis in hand     Allergies:   Bee venom, Amlodipine, and Ace inhibitors   Social History   Tobacco Use  . Smoking status: Former Smoker    Types: Pipe, Cigars    Quit date: 10/21/2003    Years since quitting: 15.9  . Smokeless tobacco: Never Used  . Tobacco comment: ocassionally smokes a cigar  Substance Use Topics  . Alcohol use: No    Comment: Heavy durin g college years, Quit '99  . Drug use: No     Family Hx: The patient's family history includes Cancer in his mother; Diabetes in his mother; Hyperlipidemia in his father and mother; Hypertension in his father and mother.  ROS:   Please see the history of present illness.     All other systems reviewed and are negative.   Labs/Other Tests and Data Reviewed:    Recent Labs: 08/15/2019: BUN 19; Creatinine, Ser 1.03; Potassium 4.2;  Sodium 137   Recent Lipid Panel No results found for: CHOL, TRIG, HDL, CHOLHDL, LDLCALC, LDLDIRECT  Wt Readings from Last 3 Encounters:  09/26/19 278 lb (126.1 kg)  06/15/19 280 lb (127 kg)  05/04/19 280 lb (127 kg)     Objective:    Vital Signs:  BP 110/71   Pulse 74   Ht 6' (1.829 m)   Wt 278 lb (126.1 kg)   BMI 37.70 kg/m    CONSTITUTIONAL:  Well nourished, well developed male in no acute distress.  EYES: anicteric MOUTH: oral mucosa is pink RESPIRATORY: Normal respiratory effort, symmetric expansion CARDIOVASCULAR: No peripheral edema SKIN: No rash, lesions or ulcers MUSCULOSKELETAL: no digital cyanosis NEURO: Cranial Nerves II-XII grossly intact, moves all extremities PSYCH: Intact judgement and insight.  A&O  x 3, Mood/affect appropriate   ASSESSMENT & PLAN:    1.  OSA -The PAP download was reviewed today and showed an AHI of 0.5/hr on 11 cm H2O with 100% compliance in using more than 4 hours nightly.  The patient has been using and benefiting from PAP use and will continue to benefit from therapy.   2.  HTN -BP very well controlled>>he sent his BP readings in which are very controlled -he had to stop the metoprolol due to bradycardia and fatigue -continue HCTZ 12.5mg  daily, Cardizem CD 240mg  daily, Spiro 100mg  daily, Olmesartan 40mg  daily and Doxazosin 8mg  daily.   3.  Obesity -he has lost 3lbs in the past 3 weeks -I have encouraged him to get into a routine exercise program and cut back on carbs and portions.   4.  Fatigue -unclear etiology>>he has not other symptoms.  -recommend he see his PCP for a TSH, CBC, Vit D and Testosterone levels and if they are normal then call me and we will set him up for an ETT and echo  COVID-19 Education: The signs and symptoms of COVID-19 were discussed with the patient and how to seek care for testing (follow up with PCP or arrange E-visit).  The importance of social distancing was discussed today.  Patient Risk:   After  full review of this patient's clinical status, I feel that they are at least moderate risk at this time.  Time:   Today, I have spent 20 minutes on telemedicine discussing medical problems including OSA< HTN, Obesity and reviewing patient's chart including PAP compliance download.  Medication Adjustments/Labs and Tests Ordered: Current medicines are reviewed at length with the patient today.  Concerns regarding medicines are outlined above.  Tests Ordered: No orders of the defined types were placed in this encounter.  Medication Changes: No orders of the defined types were placed in this encounter.   Disposition:  Follow up 6 months  Signed, , MD  09/26/2019 8:36 AM    [facility]

## 2019-09-26 ENCOUNTER — Encounter: Payer: Self-pay | Admitting: Cardiology

## 2019-09-26 ENCOUNTER — Telehealth (INDEPENDENT_AMBULATORY_CARE_PROVIDER_SITE_OTHER): Payer: No Typology Code available for payment source | Admitting: Cardiology

## 2019-09-26 VITALS — BP 110/71 | HR 74 | Ht 72.0 in | Wt 278.0 lb

## 2019-09-26 DIAGNOSIS — I10 Essential (primary) hypertension: Secondary | ICD-10-CM

## 2019-09-26 DIAGNOSIS — G4733 Obstructive sleep apnea (adult) (pediatric): Secondary | ICD-10-CM | POA: Diagnosis not present

## 2019-09-26 NOTE — Patient Instructions (Signed)
Medication Instructions:  Your physician recommends that you continue on your current medications as directed. Please refer to the Current Medication list given to you today.  *If you need a refill on your cardiac medications before your next appointment, please call your pharmacy*   Follow-Up: At [facility], you and your health needs are our priority.  As part of our continuing mission to provide you with exceptional heart care, we have created designated Provider Care Teams.  These Care Teams include your primary Cardiologist (physician) and Advanced Practice Providers (APPs -  Physician Assistants and Nurse Practitioners) who all work together to provide you with the care you need, when you need it.  Your next appointment:   6 month(s)  The format for your next appointment:   Either In Person or Virtual  Provider:   Traci Turner, MD   

## 2019-09-27 NOTE — Telephone Encounter (Signed)
Called pt to follow up with BP readings. Pt reports improvement in energy level since stopping metoprolol. Still does have some fatigue - Dr Mayford Knife deferred to PCP for follow up. Will continue current meds as BP readings have remained excellent.

## 2019-11-17 ENCOUNTER — Telehealth: Payer: Self-pay | Admitting: Adult Health

## 2019-11-17 NOTE — Telephone Encounter (Signed)
Received a new hem referral from Dr. Manus Gunning for anemia. Rodney Dominguez has been cld and scheduled to see [name] on 8/26 at 230pm w/labs at 2pm. Pt aware to arrive 15 minutes early.

## 2019-12-08 ENCOUNTER — Other Ambulatory Visit: Payer: Self-pay

## 2019-12-08 ENCOUNTER — Inpatient Hospital Stay: Payer: No Typology Code available for payment source | Attending: Adult Health | Admitting: Adult Health

## 2019-12-08 ENCOUNTER — Encounter: Payer: Self-pay | Admitting: Adult Health

## 2019-12-08 ENCOUNTER — Inpatient Hospital Stay: Payer: No Typology Code available for payment source

## 2019-12-08 ENCOUNTER — Other Ambulatory Visit: Payer: Self-pay | Admitting: Adult Health

## 2019-12-08 DIAGNOSIS — D649 Anemia, unspecified: Secondary | ICD-10-CM

## 2019-12-08 DIAGNOSIS — I10 Essential (primary) hypertension: Secondary | ICD-10-CM

## 2019-12-08 LAB — CBC WITH DIFFERENTIAL (CANCER CENTER ONLY)
Abs Immature Granulocytes: 0.01 10*3/uL (ref 0.00–0.07)
Basophils Absolute: 0 10*3/uL (ref 0.0–0.1)
Basophils Relative: 1 %
Eosinophils Absolute: 0.1 10*3/uL (ref 0.0–0.5)
Eosinophils Relative: 2 %
HCT: 34.3 % — ABNORMAL LOW (ref 39.0–52.0)
Hemoglobin: 11.9 g/dL — ABNORMAL LOW (ref 13.0–17.0)
Immature Granulocytes: 0 %
Lymphocytes Relative: 22 %
Lymphs Abs: 1.3 10*3/uL (ref 0.7–4.0)
MCH: 32.8 pg (ref 26.0–34.0)
MCHC: 34.7 g/dL (ref 30.0–36.0)
MCV: 94.5 fL (ref 80.0–100.0)
Monocytes Absolute: 0.5 10*3/uL (ref 0.1–1.0)
Monocytes Relative: 8 %
Neutro Abs: 4 10*3/uL (ref 1.7–7.7)
Neutrophils Relative %: 67 %
Platelet Count: 273 10*3/uL (ref 150–400)
RBC: 3.63 MIL/uL — ABNORMAL LOW (ref 4.22–5.81)
RDW: 12.2 % (ref 11.5–15.5)
WBC Count: 5.9 10*3/uL (ref 4.0–10.5)
nRBC: 0 % (ref 0.0–0.2)

## 2019-12-08 LAB — CMP (CANCER CENTER ONLY)
ALT: 28 U/L (ref 0–44)
AST: 16 U/L (ref 15–41)
Albumin: 4 g/dL (ref 3.5–5.0)
Alkaline Phosphatase: 94 U/L (ref 38–126)
Anion gap: 10 (ref 5–15)
BUN: 25 mg/dL — ABNORMAL HIGH (ref 6–20)
CO2: 23 mmol/L (ref 22–32)
Calcium: 10.2 mg/dL (ref 8.9–10.3)
Chloride: 105 mmol/L (ref 98–111)
Creatinine: 1.62 mg/dL — ABNORMAL HIGH (ref 0.61–1.24)
GFR, Est AFR Am: 57 mL/min — ABNORMAL LOW (ref >60)
GFR, Estimated: 49 mL/min — ABNORMAL LOW (ref >60)
Glucose, Bld: 128 mg/dL — ABNORMAL HIGH (ref 70–99)
Potassium: 4.1 mmol/L (ref 3.5–5.1)
Sodium: 138 mmol/L (ref 135–145)
Total Bilirubin: 0.8 mg/dL (ref 0.3–1.2)
Total Protein: 7.2 g/dL (ref 6.5–8.1)

## 2019-12-08 LAB — FERRITIN: Ferritin: 152 ng/mL (ref 24–336)

## 2019-12-08 LAB — HEPATITIS B SURFACE ANTIBODY,QUALITATIVE: Hep B S Ab: NONREACTIVE

## 2019-12-08 LAB — IRON AND TIBC
Iron: 86 ug/dL (ref 42–163)
Saturation Ratios: 25 % (ref 20–55)
TIBC: 339 ug/dL (ref 202–409)
UIBC: 253 ug/dL (ref 117–376)

## 2019-12-08 LAB — HEPATITIS C ANTIBODY: HCV Ab: NONREACTIVE

## 2019-12-08 LAB — HIV ANTIBODY (ROUTINE TESTING W REFLEX): HIV Screen 4th Generation wRfx: NONREACTIVE

## 2019-12-08 LAB — SEDIMENTATION RATE: Sed Rate: 28 mm/hr — ABNORMAL HIGH (ref 0–16)

## 2019-12-08 LAB — LACTATE DEHYDROGENASE: LDH: 176 U/L (ref 98–192)

## 2019-12-08 LAB — RETIC PANEL
Immature Retic Fract: 9.1 % (ref 2.3–15.9)
RBC.: 3.6 MIL/uL — ABNORMAL LOW (ref 4.22–5.81)
Retic Count, Absolute: 39.6 10*3/uL (ref 19.0–186.0)
Retic Ct Pct: 1.1 % (ref 0.4–3.1)
Reticulocyte Hemoglobin: 37.2 pg (ref >27.9)

## 2019-12-08 LAB — FOLATE: Folate: 9.6 ng/mL (ref >5.9)

## 2019-12-08 LAB — VITAMIN B12: Vitamin B-12: 223 pg/mL (ref 180–914)

## 2019-12-08 LAB — C-REACTIVE PROTEIN: CRP: 3.4 mg/dL — ABNORMAL HIGH (ref <1.0)

## 2019-12-08 LAB — SAVE SMEAR(SSMR), FOR PROVIDER SLIDE REVIEW

## 2019-12-08 LAB — HEPATITIS B CORE ANTIBODY, TOTAL: Hep B Core Total Ab: NONREACTIVE

## 2019-12-09 ENCOUNTER — Telehealth: Payer: Self-pay | Admitting: Adult Health

## 2019-12-09 ENCOUNTER — Encounter: Payer: Self-pay | Admitting: Adult Health

## 2019-12-09 DIAGNOSIS — D649 Anemia, unspecified: Secondary | ICD-10-CM | POA: Insufficient documentation

## 2019-12-09 LAB — PROTEIN ELECTROPHORESIS, SERUM, WITH REFLEX
A/G Ratio: 1.2 (ref 0.7–1.7)
Albumin ELP: 3.7 g/dL (ref 2.9–4.4)
Alpha-1-Globulin: 0.2 g/dL (ref 0.0–0.4)
Alpha-2-Globulin: 0.9 g/dL (ref 0.4–1.0)
Beta Globulin: 1.1 g/dL (ref 0.7–1.3)
Gamma Globulin: 0.8 g/dL (ref 0.4–1.8)
Globulin, Total: 3 g/dL (ref 2.2–3.9)
Total Protein ELP: 6.7 g/dL (ref 6.0–8.5)

## 2019-12-09 NOTE — Telephone Encounter (Signed)
Scheduled appt per 8/26 los. Pt confirmed appt date and time.

## 2019-12-09 NOTE — Progress Notes (Addendum)
[place]  [phone] Fax:([phone]) [phone]     ID: Rodney Dominguez DOB: 1971/01/06  MR#: [id number]  WNU#:[id number]  Patient Care Team: Gaynelle Arabian, MD as PCP - General (Family Medicine) Sueanne Margarita, MD as PCP - Sleep Medicine (Sleep Medicine) Chauncey Cruel, MD OTHER MD:  CHIEF COMPLAINT: anemia evaluation   HISTORY OF CURRENT ILLNESS: Rodney Dominguez "Rodney Dominguez" is a pleasant 49 year old man who was referred to hematology by Dr. Marisue Humble for evaluation of his recently discovered anemia.  He presented to his PCP in June, 2021 complaining of a 4 week history of worsening fatigue.  At that time he had a panel of labs completed that showed a testosterone level of 174, a vitamin D level of 23, and a hemoglobin of 12.4 with a normal MCV of 94.  He was rechecked about 2 weeks later and his testosterone level was lower at 128, his hemoglobin was 12.2.  His iron level and ferritin were normal.  He was started on a testosterone patch about 3 weeks ago, and notes his fatigue is slightly improved, however, since persistent, was referred to ensure no other etiology of his anemia. He has a past medical history of HTN, hyperlipidemia, obesity, sleep apnea, diabetes.  He sees Dr. Radford Pax for his hypertension.   [name]'s hemoglobin and MCV in Epic have been as follows:   Ref. Range 09/02/2016 13:03 09/03/2016 01:07 12/08/2019 14:08  Hemoglobin Latest Ref Range: 13.0 - 17.0 g/dL 13.4 12.9 (L) 11.9 (L)    Ref. Range 09/02/2016 13:03 09/03/2016 01:07 12/08/2019 14:08  MCV Latest Ref Range: 80.0 - 100.0 fL 90.1 90.5 94.5    The patient's subsequent history is as detailed below.  INTERVAL HISTORY: Rodney Dominguez notes that he continues to have fatigue.  He notes it is slightly better since starting the testosterone patch.  He denies any new issues such as night sweats, lymphadenopathy, unintentional weight loss, hematochezia, or melena.    REVIEW OF SYSTEMS: Rodney Dominguez notes that other than the  above he is feeling well.  He has no fever, chills, chest pain, palpitations, cough, shortness of breath, headaches, vision issues, nausea or vomiting, bowel/bladder changes, or any other concerns.  A detailed ROS was otherwise non contributory.    PAST MEDICAL HISTORY: Past Medical History:  Diagnosis Date   Diabetes mellitus without complication ([place])    type II   Hypercholesteremia    Hypertension    Kidney stone    left -first ever; surgery planned   Obesity    Sleep apnea    cpap- settings 11    PAST SURGICAL HISTORY: Past Surgical History:  Procedure Laterality Date   CYSTOSCOPY W/ RETROGRADES Left 10/28/2013   Procedure: CYSTOSCOPY WITH RETROGRADE PYELOGRAM LEFT URETEROSCOPY AND STENT PLACEMENT;  Surgeon: Alexis Frock, MD;  Location: [facility];  Service: Urology;  Laterality: Left;   NEPHROLITHOTOMY Left 10/28/2013   Procedure: NEPHROLITHOTOMY PERCUTANEOUS WITH ACCESS;  Surgeon: Alexis Frock, MD;  Location: [facility];  Service: Urology;  Laterality: Left;   WISDOM TOOTH EXTRACTION      FAMILY HISTORY Family History  Problem Relation Age of Onset   Hypertension Mother    Hyperlipidemia Mother    Cancer Mother    Diabetes Mother    Hypertension Father    Hyperlipidemia Father      SOCIAL HISTORY: Rodney Dominguez is married and lives with his wife of 64 years Ailene Ravel, and his 28 year old daughter [place].  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  His wife works at [employer].  His daughter Malen Gauze is a fourth Wellsite geologist at [employer].  He does not drink ETOH, use tobacco, or do any illicit drugs.       ADVANCED DIRECTIVES: not in place.     HEALTH MAINTENANCE: Social History   Tobacco Use   Smoking status: Former Smoker    Types: Pipe, Cigars    Quit date: 10/21/2003    Years since quitting: 16.1   Smokeless tobacco: Never Used   Tobacco comment: ocassionally smokes a cigar  Substance Use Topics   Alcohol use: No      Comment: Heavy durin g college years, Quit '99   Drug use: No       Allergies  Allergen Reactions   Bee Venom Anaphylaxis   Amlodipine     LEG SWELLING   Ace Inhibitors Cough    Current Outpatient Medications  Medication Sig Dispense Refill   aspirin EC 81 MG tablet Take 1 tablet (81 mg total) by mouth every morning.     atorvastatin (LIPITOR) 40 MG tablet Take 1 tablet (40 mg total) by mouth daily. 90 tablet 3   diltiazem (DILACOR XR) 240 MG 24 hr capsule Take 1 capsule (240 mg total) by mouth daily. 90 capsule 3   doxazosin (CARDURA) 4 MG tablet Take 8 mg by mouth daily.      EPIPEN 2-PAK 0.3 MG/0.3ML SOAJ Inject 0.3 mg into the skin as needed (Allergic reaction).      famotidine (PEPCID) 20 MG tablet Take 20 mg by mouth 2 (two) times daily.     hydrochlorothiazide (MICROZIDE) 12.5 MG capsule Take 12.5 mg by mouth daily.     metFORMIN (GLUCOPHAGE-XR) 500 MG 24 hr tablet Take 1,000 mg by mouth daily with breakfast.      olmesartan (BENICAR) 40 MG tablet Take 1 tablet (40 mg total) by mouth daily. 90 tablet 3   spironolactone (ALDACTONE) 100 MG tablet Take 1 tablet (100 mg total) by mouth daily. 90 tablet 3   Testosterone (ANDRODERM) 2 MG/24HR PT24 Place onto the skin.     triamcinolone cream (KENALOG) 0.5 % Apply 1 application topically as needed. Arthritis in hand     No current facility-administered medications for this visit.    OBJECTIVE:  Vitals:   12/08/19 1431  Pulse: 100  Resp: 20  Temp: (!) 97.2 F (36.2 C)  SpO2: 100%     Body mass index is 38.68 kg/m.   Wt Readings from Last 3 Encounters:  12/08/19 285 lb 3.2 oz (129.4 kg)  09/26/19 278 lb (126.1 kg)  06/15/19 280 lb (127 kg)      ECOG FS:1 - Symptomatic but completely ambulatory  GENERAL: Patient is a well appearing male in no acute distress HEENT:  Sclerae anicteric. Mask in place. Neck is supple.  NODES:  No cervical, supraclavicular, or axillary lymphadenopathy palpated.   LUNGS:  Clear to auscultation bilaterally.  No wheezes or rhonchi. HEART:  Regular rate and rhythm. No murmur appreciated. ABDOMEN:  Soft, nontender.  Positive, normoactive bowel sounds. No organomegaly palpated. MSK:  No focal spinal tenderness to palpation.  EXTREMITIES:  No peripheral edema.   SKIN:  Clear with no obvious rashes or skin changes. No nail dyscrasia. NEURO:  Nonfocal. Well oriented.  Appropriate affect.    LAB RESULTS:  CMP     Component Value Date/Time   NA 138 12/08/2019 1408   NA 137 08/15/2019 0852   K  4.1 12/08/2019 1408   CL 105 12/08/2019 1408   CO2 23 12/08/2019 1408   GLUCOSE 128 (H) 12/08/2019 1408   BUN 25 (H) 12/08/2019 1408   BUN 19 08/15/2019 0852   CREATININE 1.62 (H) 12/08/2019 1408   CALCIUM 10.2 12/08/2019 1408   PROT 7.2 12/08/2019 1408   ALBUMIN 4.0 12/08/2019 1408   AST 16 12/08/2019 1408   ALT 28 12/08/2019 1408   ALKPHOS 94 12/08/2019 1408   BILITOT 0.8 12/08/2019 1408   GFRNONAA 49 (L) 12/08/2019 1408   GFRAA 57 (L) 12/08/2019 1408    Lab Results  Component Value Date   TOTALPROTELP 6.7 12/08/2019   ALBUMINELP 3.7 12/08/2019   A1GS 0.2 12/08/2019   A2GS 0.9 12/08/2019   BETS 1.1 12/08/2019   GAMS 0.8 12/08/2019   MSPIKE Not Observed 12/08/2019    No results found for: KPAFRELGTCHN, LAMBDASER, [facility]  Lab Results  Component Value Date   WBC 5.9 12/08/2019   NEUTROABS 4.0 12/08/2019   HGB 11.9 (L) 12/08/2019   HCT 34.3 (L) 12/08/2019   MCV 94.5 12/08/2019   PLT 273 12/08/2019      Chemistry      Component Value Date/Time   NA 138 12/08/2019 1408   NA 137 08/15/2019 0852   K 4.1 12/08/2019 1408   CL 105 12/08/2019 1408   CO2 23 12/08/2019 1408   BUN 25 (H) 12/08/2019 1408   BUN 19 08/15/2019 0852   CREATININE 1.62 (H) 12/08/2019 1408      Component Value Date/Time   CALCIUM 10.2 12/08/2019 1408   ALKPHOS 94 12/08/2019 1408   AST 16 12/08/2019 1408   ALT 28 12/08/2019 1408   BILITOT 0.8 12/08/2019  1408       No results found for: LABCA2  No components found for: IRJJOA416  No results for input(s): INR in the last 168 hours.  No results found for: LABCA2  No results found for: SAY301  No results found for: SWF093  No results found for: ATF573  No results found for: CA2729  No components found for: HGQUANT  No results found for: CEA1 / No results found for: CEA1   No results found for: AFPTUMOR  No results found for: CHROMOGRNA  No results found for: PSA1  Appointment on 12/08/2019  Component Date Value Ref Range Status   WBC Count 12/08/2019 5.9  4.0 - 10.5 K/uL Final   RBC 12/08/2019 3.63* 4.22 - 5.81 MIL/uL Final   Hemoglobin 12/08/2019 11.9* 13.0 - 17.0 g/dL Final   HCT 12/08/2019 34.3* 39 - 52 % Final   MCV 12/08/2019 94.5  80.0 - 100.0 fL Final   MCH 12/08/2019 32.8  26.0 - 34.0 pg Final   MCHC 12/08/2019 34.7  30.0 - 36.0 g/dL Final   RDW 12/08/2019 12.2  11.5 - 15.5 % Final   Platelet Count 12/08/2019 273  150 - 400 K/uL Final   nRBC 12/08/2019 0.0  0.0 - 0.2 % Final   Neutrophils Relative % 12/08/2019 67  % Final   Neutro Abs 12/08/2019 4.0  1.7 - 7.7 K/uL Final   Lymphocytes Relative 12/08/2019 22  % Final   Lymphs Abs 12/08/2019 1.3  0.7 - 4.0 K/uL Final   Monocytes Relative 12/08/2019 8  % Final   Monocytes Absolute 12/08/2019 0.5  0 - 1 K/uL Final   Eosinophils Relative 12/08/2019 2  % Final   Eosinophils Absolute 12/08/2019 0.1  0 - 0 K/uL Final   Basophils Relative 12/08/2019  1  % Final   Basophils Absolute 12/08/2019 0.0  0 - 0 K/uL Final   Immature Granulocytes 12/08/2019 0  % Final   Abs Immature Granulocytes 12/08/2019 0.01  0.00 - 0.07 K/uL Final   Performed at [facility], [place] [address] [place], [place] [address]   Retic Ct Pct 12/08/2019 1.1  0.4 - 3.1 % Final   RBC. 12/08/2019 3.60* 4.22 - 5.81 MIL/uL Final   Retic Count, Absolute 12/08/2019 39.6  19.0 - 186.0 K/uL Final    Immature Retic Fract 12/08/2019 9.1  2.3 - 15.9 % Final   Reticulocyte Hemoglobin 12/08/2019 37.2  >27.9 pg Final   Comment:        Given the high negative predictive value of a RET-He result > 32 pg iron deficiency is essentially excluded. If this patient is anemic other etiologies should be considered. Performed at [facility], [place] [address] [place], [place] [address]    Smear Review 12/08/2019 SMEAR STAINED AND AVAILABLE FOR REVIEW   Final   Performed at [facility], [place] [address] [place], [place] [address]   Sodium 12/08/2019 138  135 - 145 mmol/L Final   Potassium 12/08/2019 4.1  3.5 - 5.1 mmol/L Final   Chloride 12/08/2019 105  98 - 111 mmol/L Final   CO2 12/08/2019 23  22 - 32 mmol/L Final   Glucose, Bld 12/08/2019 128* 70 - 99 mg/dL Final   Glucose reference range applies only to samples taken after fasting for at least 8 hours.   BUN 12/08/2019 25* 6 - 20 mg/dL Final   Creatinine 12/08/2019 1.62* 0.61 - 1.24 mg/dL Final   Calcium 12/08/2019 10.2  8.9 - 10.3 mg/dL Final   Total Protein 12/08/2019 7.2  6.5 - 8.1 g/dL Final   Albumin 12/08/2019 4.0  3.5 - 5.0 g/dL Final   AST 12/08/2019 16  15 - 41 U/L Final   ALT 12/08/2019 28  0 - 44 U/L Final   Alkaline Phosphatase 12/08/2019 94  38 - 126 U/L Final   Total Bilirubin 12/08/2019 0.8  0.3 - 1.2 mg/dL Final   GFR, Est Non Af Am 12/08/2019 49* >60 mL/min Final   GFR, Est AFR Am 12/08/2019 57* >60 mL/min Final   Anion gap 12/08/2019 10  5 - 15 Final   Performed at [facility], [place] [address], [place], [place] [address]   Hep B S Ab 12/08/2019 NON REACTIVE  NON REACTIVE Final   Comment: (NOTE) Inconsistent with immunity, less than 10 mIU/mL.  Performed at [facility], [place] [address] [place], [place] [address]    Hep B Core Total Ab 12/08/2019 NON REACTIVE  NON REACTIVE Final   Performed at [facility],  [place] [address] [place], [place] [address]   HCV Ab 12/08/2019 NON REACTIVE  NON REACTIVE Final   Comment: (NOTE) Nonreactive HCV antibody screen is consistent with no HCV infections,  unless recent infection is suspected or other evidence exists to indicate HCV infection.  Performed at [facility], [place] [address] [place], [place] [address]    HIV Screen 4th Generation wRfx 12/08/2019 Non Reactive  Non Reactive Final   Performed at [facility], [place] [address], [place], [place] [address]   Total Protein ELP 12/08/2019 6.7  6.0 - 8.5 g/dL Final   Albumin ELP 12/08/2019 3.7  2.9 - 4.4 g/dL Final   Alpha-1-Globulin 12/08/2019  0.2  0.0 - 0.4 g/dL Final   Alpha-2-Globulin 12/08/2019 0.9  0.4 - 1.0 g/dL Final   Beta Globulin 12/08/2019 1.1  0.7 - 1.3 g/dL Final   Gamma Globulin 12/08/2019 0.8  0.4 - 1.8 g/dL Final   M-Spike, % 12/08/2019 Not Observed  Not Observed g/dL Final   Globulin, Total 12/08/2019 3.0  2.2 - 3.9 g/dL Corrected   A/G Ratio 12/08/2019 1.2  0.7 - 1.7 Corrected   Comment 12/08/2019 Comment   Corrected   Comment: (NOTE) Protein electrophoresis scan will follow via computer, mail, or courier delivery.    SPEP Interpretation 12/08/2019 Comment   Final   Comment: (NOTE) The SPE pattern appears unremarkable. Evidence of monoclonal protein is not apparent. Performed At: [facility] [place], [place] [id number] Rush Farmer MD [id number]    CRP 12/08/2019 3.4* <1.0 mg/dL Final   Performed at [place] [address] [place], [place] [address]   Sed Rate 12/08/2019 28* 0 - 16 mm/hr Final   Performed at [facility], [place] [address] [place], [place] [address]   Folate 12/08/2019 9.6  >5.9 ng/mL Final   Performed at [facility], [place] [address], [place], [place] [address]   Vitamin B-12 12/08/2019 223  180 - 914 pg/mL Final   Comment: (NOTE) This assay is  not validated for testing neonatal or myeloproliferative syndrome specimens for Vitamin B12 levels. Performed at [facility], [place] [address] [place], [place] [address]    Ferritin 12/08/2019 152  24 - 336 ng/mL Final   Performed at [facility], [place] [address] [place], [place] [address]   Iron 12/08/2019 86  42 - 163 ug/dL Final   TIBC 12/08/2019 339  202 - 409 ug/dL Final   Saturation Ratios 12/08/2019 25  20 - 55 % Final   UIBC 12/08/2019 253  117 - 376 ug/dL Final   Performed at [facility], [place] [address], [place], [place] [address]   LDH 12/08/2019 176  98 - 192 U/L Final   Performed at [facility], [place] [address], [place],  [address]    (this displays the last labs from the last 3 days)  Lab Results  Component Value Date   TOTALPROTELP 6.7 12/08/2019   ALBUMINELP 3.7 12/08/2019   A1GS 0.2 12/08/2019   A2GS 0.9 12/08/2019   BETS 1.1 12/08/2019   GAMS 0.8 12/08/2019   MSPIKE Not Observed 12/08/2019   (this displays SPEP labs)  No results found for: KPAFRELGTCHN, LAMBDASER, KAPLAMBRATIO (kappa/lambda light chains)  No results found for: HGBA, HGBA2QUANT, HGBFQUANT, HGBSQUAN (Hemoglobinopathy evaluation)   Lab Results  Component Value Date   LDH 176 12/08/2019    Lab Results  Component Value Date   IRON 86 12/08/2019   TIBC 339 12/08/2019   IRONPCTSAT 25 12/08/2019   (Iron and TIBC)  Lab Results  Component Value Date   FERRITIN 152 12/08/2019    Urinalysis    Component Value Date/Time   COLORURINE YELLOW 10/04/2013 0630   APPEARANCEUR CLEAR 10/04/2013 0630   LABSPEC 1.013 10/04/2013 0630   PHURINE 6.5 10/04/2013 0630   GLUCOSEU NEGATIVE 10/04/2013 0630   HGBUR TRACE (A) 10/04/2013 0630   BILIRUBINUR NEGATIVE 10/04/2013 0630   KETONESUR NEGATIVE 10/04/2013 0630   PROTEINUR NEGATIVE 10/04/2013 0630   UROBILINOGEN 1.0 10/04/2013 0630    NITRITE NEGATIVE 10/04/2013 0630   LEUKOCYTESUR TRACE (A) 10/04/2013 0630  STUDIES: No results found.    ASSESSMENT: 49 y.o. [place] man with recently discovered anemia and hypotestosteronism.     (1) Mild anemia, hemoglobin of 12.4  (a) Full work up of labs pending  (b) likely secondary to low testosterone  PLAN: Rodney Dominguez met with Dr. Jana Hakim and myself to discuss his anemia.  First and foremost, after looking at his blood film, Dr. Jana Hakim reviewed with him that there are no signs of leukemia or dysplasia in his blood. Rodney Dominguez was very happy to know this.  Dr. Jana Hakim explained that there are three types of blood cells in the CBC that come from the bone marrow.  1. White blood cells: infection fighters, [name]'s are normal in number and in appearance.   2. Platelets: Clotting cells.  [name]'s are normal in number and appearance.  3. Red blood cells: these are oxygen carrying cells, [name]'s are normal in appearance, but decreased in number.  Dr. Jana Hakim reviewed that red blood cells are like money.  If your red blood cells are low you are either not making enough, or your spending what you make (either by blood loss, or RBC destruction).  [name]'s reticulocyte count is low considering his anemia.  This is likely secondary to his low testosterone level.  We have ordered other labs to rule other processes out.  We will call Rodney Dominguez with these results.  We will also see him back in 3 months for a lab visit to give his testosterone patch time to work, and evaluate if that is helping his hemoglobin.    He was given Dr. Virgie Dad card and will call if he needs anything prior to his next appointment.     Wilber Bihari, NP 12/12/19 7:07 PM Medical Oncology and Hematology [facility] [place], [place] [address] Tel. [phone]    Fax. [phone]   ADDENDUM: 49 year old [place] man with mild anemia (but a significant drop from baseline) accompanied by a  mild increase in his MCV, which remains in the normal range.  Review of the peripheral blood film shows no diagnostic abnormalities and particularly no dysplasia or left shift in the white cell series. It is reassuring that 2 of his 3 cell lines (white cells and platelets) are unremarkable.  We did an extensive lab screen and there is no evidence of B12 folate or iron deficiency; the SPEP shows no monoclonal protein; viral studies were negative. LDH was normal as was the total bilirubin, not consistent with hemolysis.  He does have mild to moderate inflammation based on his CRP and ESR results (3.4 and 28 respectively). There may be an element of anemia of chronic inflammation. At this visit also there was mild renal insufficiency with a creatinine of 1.62 and GFR of 49. This of course can be a secondary cause for his anemia (due to inadequate erythropoietin production).  We do suspect that his low testosterone level may be the main driver here however. This is being replaced. We have asked him to return late November for lab recheck, specifically CBC and review blood film. He will not have a formal visit but will drop by my office to discuss those results. If the problem has resolved with testosterone replacement as we hope no further follow-up will be needed.  I personally saw this patient and performed a substantive portion of this encounter with the listed APP documented above.   Chauncey Cruel, MD Medical Oncology and Hematology [facility] [place],  [place] [address] Tel. [phone]    Fax. [phone]    *Total Encounter Time as defined by the Centers for Medicare and Medicaid Services includes, in addition to the face-to-face time of a patient visit (documented in the note above) non-face-to-face time: obtaining and reviewing outside history, ordering and reviewing medications, tests or procedures, care coordination (communications with other health  care professionals or caregivers) and documentation in the medical record.

## 2020-03-12 ENCOUNTER — Other Ambulatory Visit: Payer: Self-pay | Admitting: Oncology

## 2020-03-12 ENCOUNTER — Other Ambulatory Visit: Payer: Self-pay

## 2020-03-12 ENCOUNTER — Inpatient Hospital Stay: Payer: No Typology Code available for payment source | Attending: Adult Health

## 2020-03-12 DIAGNOSIS — D649 Anemia, unspecified: Secondary | ICD-10-CM | POA: Insufficient documentation

## 2020-03-12 DIAGNOSIS — E291 Testicular hypofunction: Secondary | ICD-10-CM | POA: Diagnosis not present

## 2020-03-12 LAB — CBC WITH DIFFERENTIAL/PLATELET
Abs Immature Granulocytes: 0.01 10*3/uL (ref 0.00–0.07)
Basophils Absolute: 0 10*3/uL (ref 0.0–0.1)
Basophils Relative: 1 %
Eosinophils Absolute: 0.2 10*3/uL (ref 0.0–0.5)
Eosinophils Relative: 3 %
HCT: 33.9 % — ABNORMAL LOW (ref 39.0–52.0)
Hemoglobin: 11.8 g/dL — ABNORMAL LOW (ref 13.0–17.0)
Immature Granulocytes: 0 %
Lymphocytes Relative: 29 %
Lymphs Abs: 1.4 10*3/uL (ref 0.7–4.0)
MCH: 32.8 pg (ref 26.0–34.0)
MCHC: 34.8 g/dL (ref 30.0–36.0)
MCV: 94.2 fL (ref 80.0–100.0)
Monocytes Absolute: 0.4 10*3/uL (ref 0.1–1.0)
Monocytes Relative: 9 %
Neutro Abs: 2.9 10*3/uL (ref 1.7–7.7)
Neutrophils Relative %: 58 %
Platelets: 264 10*3/uL (ref 150–400)
RBC: 3.6 MIL/uL — ABNORMAL LOW (ref 4.22–5.81)
RDW: 12.4 % (ref 11.5–15.5)
WBC: 5 10*3/uL (ref 4.0–10.5)
nRBC: 0 % (ref 0.0–0.2)

## 2020-03-12 LAB — COMPREHENSIVE METABOLIC PANEL
ALT: 40 U/L (ref 0–44)
AST: 23 U/L (ref 15–41)
Albumin: 3.8 g/dL (ref 3.5–5.0)
Alkaline Phosphatase: 77 U/L (ref 38–126)
Anion gap: 13 (ref 5–15)
BUN: 21 mg/dL — ABNORMAL HIGH (ref 6–20)
CO2: 20 mmol/L — ABNORMAL LOW (ref 22–32)
Calcium: 9.3 mg/dL (ref 8.9–10.3)
Chloride: 107 mmol/L (ref 98–111)
Creatinine, Ser: 1.13 mg/dL (ref 0.61–1.24)
GFR, Estimated: >60 mL/min (ref >60)
Glucose, Bld: 142 mg/dL — ABNORMAL HIGH (ref 70–99)
Potassium: 4.1 mmol/L (ref 3.5–5.1)
Sodium: 140 mmol/L (ref 135–145)
Total Bilirubin: 0.8 mg/dL (ref 0.3–1.2)
Total Protein: 6.6 g/dL (ref 6.5–8.1)

## 2020-03-12 LAB — C-REACTIVE PROTEIN: CRP: 0.7 mg/dL (ref <1.0)

## 2020-03-12 LAB — SEDIMENTATION RATE: Sed Rate: 15 mm/hr (ref 0–16)

## 2020-03-12 NOTE — Progress Notes (Unsigned)
I called Rodney Dominguez with the results of his complete blood count.  I do not have the rest of his labs.  There has been no improvement in his hemoglobin.  I am waiting on the testosterone level and on the inflammatory markers.  I will then send him a letter with copy to his physician but I think all he will need from this point is observation.

## 2020-03-13 ENCOUNTER — Encounter: Payer: Self-pay | Admitting: Oncology

## 2020-03-13 ENCOUNTER — Other Ambulatory Visit: Payer: Self-pay | Admitting: Oncology

## 2020-03-13 LAB — TESTOSTERONE: Testosterone: 214 ng/dL — ABNORMAL LOW (ref 264–916)

## 2020-03-22 ENCOUNTER — Telehealth: Payer: Self-pay | Admitting: Cardiology

## 2020-03-22 MED ORDER — DILTIAZEM HCL ER 240 MG PO CP24
240.0000 mg | ORAL_CAPSULE | Freq: Every day | ORAL | 1 refills | Status: DC
Start: 2020-03-22 — End: 2020-11-30

## 2020-03-22 NOTE — Telephone Encounter (Signed)
[facility] requesting clarification for pt's medication diltiazem. Pharmacy is stating that pt is allergic to amlodipine and medication diltiazem has an ingredient in this medication, that amlodipine has in it as well. Would Dr. Mayford Knife still like to refill this medication?  Ph# [phone] option 2, reference # [id number] Please address

## 2020-03-22 NOTE — Telephone Encounter (Signed)
Called [facility] to confirm that per Dr. Norris Cross nurse [place], that it is ok to refill diltiazem 240 mg capsule. Pharmacist verbalized understanding.

## 2020-03-22 NOTE — Telephone Encounter (Signed)
New message:     pharmacy calling concering Dilacor CAPS 240 mg.

## 2020-04-19 ENCOUNTER — Telehealth: Payer: Self-pay | Admitting: Cardiology

## 2020-04-19 NOTE — Telephone Encounter (Signed)
Patient has an appt on 04/27/20 with Dr. Mayford Knife for sleep, does he need to bring his SD card from his CPAP machine by the office prior to the appt?

## 2020-04-25 NOTE — Telephone Encounter (Signed)
Patient brought his chip by the office today (Wednesday) to get a download.

## 2020-04-25 NOTE — Progress Notes (Unsigned)
Virtual Visit via Video Note   This visit type was conducted due to national recommendations for restrictions regarding the COVID-19 Pandemic (e.g. social distancing) in an effort to limit this patient's exposure and mitigate transmission in our community.  Due to his co-morbid illnesses, this patient is at least at moderate risk for complications without adequate follow up.  This format is felt to be most appropriate for this patient at this time.  All issues noted in this document were discussed and addressed.  A limited physical exam was performed with this format.  Please refer to the patient's chart for his consent to telehealth for [facility].  Evaluation Performed:  Follow-up visit  This visit type was conducted due to national recommendations for restrictions regarding the COVID-19 Pandemic (e.g. social distancing).  This format is felt to be most appropriate for this patient at this time.  All issues noted in this document were discussed and addressed.  No physical exam was performed (except for noted visual exam findings with Video Visits).  Please refer to the patient's chart (MyChart message for video visits and phone note for telephone visits) for the patient's consent to telehealth for [facility].  Date:  04/25/2020   ID:  Rodney Dominguez, DOB 29-Dec-1970, MRN [id number]  Patient Location:  Home  Provider location:   [place]  PCP:  Blair Heys, MD  Sleep Medicine:  Armanda Magic, MD Electrophysiologist:  None   Chief Complaint:  OSA, HTN  History of Present Illness:    Rodney Dominguez is a 50 y.o. male who presents via audio/video conferencing for a telehealth visit today.    Rodney Dominguez a 50 y.o.malewith a hx of OSAon CPAP, HTN and obesity.  He in intolerant to amlodipine due to LE edema and bradycardia has limited increased doses of BB or CCB.  Renal duplex showed on evidence of RAS and 24 hour urine catecholamines were normal.  PRA/Aldo/Renin were  normal.  He also was placed on HCTZ due to nephrolithiasis but developed significant hypokalemia that was very difficult to replete.  The diuretic was stopped and hypokalemia improved. His Urologist placed him back on HCTZ and spiro 25mg  daily for HTN was added as well as to help prevent hypokalemia.   Toprol was subsequently stopped due to fatigue.   He is here today for followup and is doing well.  He denies any chest pain or pressure, SOB, DOE, PND, orthopnea, LE edema, dizziness, palpitations or syncope. He is compliant with his meds and is tolerating meds with no SE.  He is doing well with his CPAP device and thinks that he has gotten used to it.  He tolerates the mask and feels the pressure is adequate.  Since going on CPAP he feels rested in the am and has no significant daytime sleepiness.  He denies any significant mouth or nasal dryness or nasal congestion.  He does not think that he snores.  He is complaining of fatigue that has occurred over the past year.  He has no DOE or CP but gets fatigued when he is out riding a bike or trying to be active.  He says that he has not really gained much weight during COVID.    The patient does not have symptoms concerning for COVID-19 infection (fever, chills, cough, or new shortness of breath).   Prior CV studies:   The following studies were reviewed today:  PAP compliance download  Past Medical History:  Diagnosis Date  . Diabetes mellitus without complication (  HCC)    type II  . Hypercholesteremia   . Hypertension   . Kidney stone    left -first ever; surgery planned  . Obesity   . Sleep apnea    cpap- settings 11   Past Surgical History:  Procedure Laterality Date  . CYSTOSCOPY W/ RETROGRADES Left 10/28/2013   Procedure: CYSTOSCOPY WITH RETROGRADE PYELOGRAM LEFT URETEROSCOPY AND STENT PLACEMENT;  Surgeon: Sebastian Ache, MD;  Location: [facility];  Service: Urology;  Laterality: Left;  . NEPHROLITHOTOMY Left 10/28/2013   Procedure:  NEPHROLITHOTOMY PERCUTANEOUS WITH ACCESS;  Surgeon: Sebastian Ache, MD;  Location: [facility];  Service: Urology;  Laterality: Left;  . WISDOM TOOTH EXTRACTION       No outpatient medications have been marked as taking for the 04/27/20 encounter (Appointment) with Rodney Reichert, MD.     Allergies:   Bee venom, Amlodipine, and Ace inhibitors   Social History   Tobacco Use  . Smoking status: Former Smoker    Types: Pipe, Cigars    Quit date: 10/21/2003    Years since quitting: 16.5  . Smokeless tobacco: Never Used  . Tobacco comment: ocassionally smokes a cigar  Substance Use Topics  . Alcohol use: No    Comment: Heavy durin g college years, Quit '99  . Drug use: No     Family Hx: The patient's family history includes Cancer in his mother; Diabetes in his mother; Hyperlipidemia in his father and mother; Hypertension in his father and mother.  ROS:   Please see the history of present illness.     All other systems reviewed and are negative.   Labs/Other Tests and Data Reviewed:    Recent Labs: 03/12/2020: ALT 40; BUN 21; Creatinine, Ser 1.13; Hemoglobin 11.8; Platelets 264; Potassium 4.1; Sodium 140   Recent Lipid Panel No results found for: CHOL, TRIG, HDL, CHOLHDL, LDLCALC, LDLDIRECT  Wt Readings from Last 3 Encounters:  12/08/19 285 lb 3.2 oz (129.4 kg)  09/26/19 278 lb (126.1 kg)  06/15/19 280 lb (127 kg)     Objective:    Vital Signs:  There were no vitals taken for this visit.   CONSTITUTIONAL:  Well nourished, well developed male in no acute distress.  EYES: anicteric MOUTH: oral mucosa is pink RESPIRATORY: Normal respiratory effort, symmetric expansion CARDIOVASCULAR: No peripheral edema SKIN: No rash, lesions or ulcers MUSCULOSKELETAL: no digital cyanosis NEURO: Cranial Nerves II-XII grossly intact, moves all extremities PSYCH: Intact judgement and insight.  A&O x 3, Mood/affect appropriate   ASSESSMENT & PLAN:    1.  OSA -  The patient is tolerating  PAP therapy well without any problems. The PAP download was reviewed today and showed an AHI of 1.6/hr on 18 cm H2O with 70% compliance in using more than 4 hours nightly.  The patient has been using and benefiting from PAP use and will continue to benefit from therapy.   2.  HTN -BP very well controlled>>he sent his BP readings in which are very controlled -he had to stop the metoprolol due to bradycardia and fatigue -continue HCTZ 12.5mg  daily, Cardizem CD 240mg  daily, Spiro 100mg  daily, Olmesartan 40mg  daily and Doxazosin 8mg  daily.   3.  Obesity -he has lost 3lbs in the past 3 weeks -I have encouraged him to get into a routine exercise program and cut back on carbs and portions.   4.  Fatigue -unclear etiology>>he has not other symptoms.  -recommend he see his PCP for a TSH, CBC, Vit D and Testosterone  levels and if they are normal then call me and we will set him up for an ETT and echo  COVID-19 Education: The signs and symptoms of COVID-19 were discussed with the patient and how to seek care for testing (follow up with PCP or arrange E-visit).  The importance of social distancing was discussed today.  Patient Risk:   After full review of this patient's clinical status, I feel that they are at least moderate risk at this time.  Time:   Today, I have spent 20 minutes on telemedicine discussing medical problems including OSA< HTN, Obesity and reviewing patient's chart including PAP compliance download.  Medication Adjustments/Labs and Tests Ordered: Current medicines are reviewed at length with the patient today.  Concerns regarding medicines are outlined above.  Tests Ordered: No orders of the defined types were placed in this encounter.  Medication Changes: No orders of the defined types were placed in this encounter.   Disposition:  Follow up 6 months  Signed, Armanda Magic, MD  04/25/2020 12:22 PM    [facility]

## 2020-04-27 ENCOUNTER — Encounter: Payer: No Typology Code available for payment source | Admitting: Cardiology

## 2020-04-27 ENCOUNTER — Other Ambulatory Visit: Payer: Self-pay

## 2020-04-27 ENCOUNTER — Encounter: Payer: Self-pay | Admitting: Cardiology

## 2020-04-27 VITALS — BP 115/72 | HR 68 | Ht 72.0 in | Wt 282.0 lb

## 2020-04-27 NOTE — Progress Notes (Signed)
Date:  05/04/2020   ID:  [name], DOB 1970/06/07, MRN [id number]  PCP:  Blair Heys, MD  Sleep Medicine:  Armanda Magic, MD Electrophysiologist:  None   Chief Complaint:  OSA, HTN  History of Present Illness:    Rodney Dominguez is a 50 y.o. male with a hx of OSAon CPAP, HTN and obesity.  He in intolerant to amlodipine due to LE edema and bradycardia has limited increased doses of BB or CCB.  Renal duplex showed on evidence of RAS and 24 hour urine catecholamines were normal.  PRA/Aldo/Renin were normal.  He also was placed on HCTZ due to nephrolithiasis but developed significant hypokalemia that was very difficult to replete.  The diuretic was stopped and hypokalemia improved. His Urologist placed him back on HCTZ and spiro 25mg  daily for HTN was added as well as to help prevent hypokalemia. Toprol was subsequently stopped due to fatigue. Recent Urology appt his HCTZ was stopped and spiro decreased to 25mg  daily and started on Indapamide 1.25mg  daily.  He was dx with low testosterone and placed on testosterone and his fatigue has significantly improved.  He is here today for followup and is doing well.  He denies any chest pain or pressure, SOB, DOE, PND, orthopnea, LE edema, dizziness, palpitations or syncope. He is compliant with his meds and is tolerating meds with no SE.    He is doing well with his CPAP device and thinks that he has gotten used to it.  He tolerates the nasal pillow mask and feels the pressure is adequate.  Since going on CPAP he feels rested in the am and has no significant daytime sleepiness.  He denies any significant mouth or nasal dryness or nasal congestion.  He does not think that he snores.    Prior CV studies:   The following studies were reviewed today:  PAP compliance download, outside labs  EKG was performed in the office today and showed NSR with no ST changes  Past Medical History:  Diagnosis Date  . Diabetes mellitus without complication (HCC)     type II  . Hypercholesteremia   . Hypertension   . Kidney stone    left -first ever; surgery planned  . Obesity   . Sleep apnea    cpap- settings 11   Past Surgical History:  Procedure Laterality Date  . CYSTOSCOPY W/ RETROGRADES Left 10/28/2013   Procedure: CYSTOSCOPY WITH RETROGRADE PYELOGRAM LEFT URETEROSCOPY AND STENT PLACEMENT;  Surgeon: , MD;  Location: [facility];  Service: Urology;  Laterality: Left;  . NEPHROLITHOTOMY Left 10/28/2013   Procedure: NEPHROLITHOTOMY PERCUTANEOUS WITH ACCESS;  Surgeon: Sebastian Ache, MD;  Location: [facility];  Service: Urology;  Laterality: Left;  . WISDOM TOOTH EXTRACTION       Current Meds  Medication Sig  . aspirin EC 81 MG tablet Take 1 tablet (81 mg total) by mouth every morning.  10/30/2013 atorvastatin (LIPITOR) 40 MG tablet Take 1 tablet (40 mg total) by mouth daily.  Sebastian Ache diltiazem (DILACOR XR) 240 MG 24 hr capsule Take 1 capsule (240 mg total) by mouth daily.  Marland Kitchen doxazosin (CARDURA) 4 MG tablet Take 8 mg by mouth daily.   Marland Kitchen EPIPEN 2-PAK 0.3 MG/0.3ML SOAJ Inject 0.3 mg into the skin as needed (Allergic reaction).   . famotidine (PEPCID) 20 MG tablet Take 20 mg by mouth 2 (two) times daily.  . indapamide (LOZOL) 1.25 MG tablet Take 1.25 mg by mouth daily.  . metFORMIN (GLUCOPHAGE-XR) 500 MG 24 hr  tablet Take 1,000 mg by mouth daily with breakfast.   . olmesartan (BENICAR) 40 MG tablet Take 1 tablet (40 mg total) by mouth daily.  Marland Kitchen triamcinolone cream (KENALOG) 0.5 % Apply 1 application topically as needed. Arthritis in hand     Allergies:   Bee venom, Amlodipine, and Ace inhibitors   Social History   Tobacco Use  . Smoking status: Former Smoker    Types: Pipe, Cigars    Quit date: 10/21/2003    Years since quitting: 16.5  . Smokeless tobacco: Never Used  . Tobacco comment: ocassionally smokes a cigar  Substance Use Topics  . Alcohol use: No    Comment: Heavy durin g college years, Quit '99  . Drug use: No     Family Hx: The  patient's family history includes Cancer in his mother; Diabetes in his mother; Hyperlipidemia in his father and mother; Hypertension in his father and mother.  ROS:   Please see the history of present illness.     All other systems reviewed and are negative.   Labs/Other Tests and Data Reviewed:    Recent Labs: 03/12/2020: ALT 40; BUN 21; Creatinine, Ser 1.13; Hemoglobin 11.8; Platelets 264; Potassium 4.1; Sodium 140   Recent Lipid Panel No results found for: CHOL, TRIG, HDL, CHOLHDL, LDLCALC, LDLDIRECT  Wt Readings from Last 3 Encounters:  05/04/20 295 lb 6.4 oz (134 kg)  04/27/20 282 lb (127.9 kg)  12/08/19 285 lb 3.2 oz (129.4 kg)     Objective:    Vital Signs:  BP 120/72   Pulse 67   Ht 6' (1.829 m)   Wt 295 lb 6.4 oz (134 kg)   SpO2 97%   BMI 40.06 kg/m    GEN: Well nourished, well developed in no acute distress HEENT: Normal NECK: No JVD; No carotid bruits LYMPHATICS: No lymphadenopathy CARDIAC:RRR, no murmurs, rubs, gallops RESPIRATORY:  Clear to auscultation without rales, wheezing or rhonchi  ABDOMEN: Soft, non-tender, non-distended MUSCULOSKELETAL:  No edema; No deformity  SKIN: Warm and dry NEUROLOGIC:  Alert and oriented x 3 PSYCHIATRIC:  Normal affect    ASSESSMENT & PLAN:    1.  OSA -  The patient is tolerating PAP therapy well without any problems. The PAP download was reviewed today and showed an AHI of 1.6/hr on 18 cm H2O with 70% compliance in using more than 4 hours nightly.  The patient has been using and benefiting from PAP use and will continue to benefit from therapy.   2.  HTN -BP is well controlled on exam today -he had to stop the metoprolol due to bradycardia and fatigue -continue Cardizem CD 240mg  daily, Olmesartan 40mg  daily, Spiro 25mg  daily, Indapamide 1.25mg  daily and Doxazosin 8mg  daily.  -SCR stable at 1.08 and K+ 4.3 in Nov 2021  3.  Obesity -I have encouraged him to get into a routine exercise program and cut back on carbs  and portions.   4.  Fatigue -significantly improved on testosterone  Medication Adjustments/Labs and Tests Ordered: Current medicines are reviewed at length with the patient today.  Concerns regarding medicines are outlined above.  Tests Ordered: Orders Placed This Encounter  Procedures  . EKG 12-Lead   Medication Changes: No orders of the defined types were placed in this encounter.   Disposition:  Follow up 6 months  Signed, , MD  05/04/2020 2:41 PM    [facility]

## 2020-04-27 NOTE — Progress Notes (Signed)
This encounter was created in error - please disregard.

## 2020-05-04 ENCOUNTER — Ambulatory Visit: Payer: No Typology Code available for payment source | Admitting: Cardiology

## 2020-05-04 ENCOUNTER — Other Ambulatory Visit: Payer: Self-pay

## 2020-05-04 ENCOUNTER — Encounter: Payer: Self-pay | Admitting: Cardiology

## 2020-05-04 VITALS — BP 120/72 | HR 67 | Ht 72.0 in | Wt 295.4 lb

## 2020-05-04 DIAGNOSIS — G4733 Obstructive sleep apnea (adult) (pediatric): Secondary | ICD-10-CM

## 2020-05-04 DIAGNOSIS — I10 Essential (primary) hypertension: Secondary | ICD-10-CM | POA: Diagnosis not present

## 2020-05-04 NOTE — Patient Instructions (Signed)
Medication Instructions:  Your physician recommends that you continue on your current medications as directed. Please refer to the Current Medication list given to you today.  *If you need a refill on your cardiac medications before your next appointment, please call your pharmacy*   Lab Work: NONE  Testing/Procedures: NONE   Follow-Up: At [facility], you and your health needs are our priority.  As part of our continuing mission to provide you with exceptional heart care, we have created designated Provider Care Teams.  These Care Teams include your primary Cardiologist (physician) and Advanced Practice Providers (APPs -  Physician Assistants and Nurse Practitioners) who all work together to provide you with the care you need, when you need it.  We recommend signing up for the patient portal called "MyChart".  Sign up information is provided on this After Visit Summary.  MyChart is used to connect with patients for Virtual Visits (Telemedicine).  Patients are able to view lab/test results, encounter notes, upcoming appointments, etc.  Non-urgent messages can be sent to your provider as well.   To learn more about what you can do with MyChart, go to [email or web address].    Your next appointment:   1 year(s)  The format for your next appointment:   In Person  Provider:   You may see Armanda Magic, MD or one of the following Advanced Practice Providers on your designated Care Team:    Ronie Spies, PA-C  Jacolyn Reedy, PA-C

## 2020-06-27 ENCOUNTER — Other Ambulatory Visit: Payer: Self-pay | Admitting: Cardiology

## 2020-11-30 ENCOUNTER — Other Ambulatory Visit: Payer: Self-pay | Admitting: Cardiology

## 2021-07-19 NOTE — Progress Notes (Signed)
? ?Virtual Visit via Video Note  ? ?This visit type was conducted due to national recommendations for restrictions regarding the COVID-19 Pandemic (e.g. social distancing) in an effort to limit this patient's exposure and mitigate transmission in our community.  Due to his co-morbid illnesses, this patient is at least at moderate risk for complications without adequate follow up.  This format is felt to be most appropriate for this patient at this time.  All issues noted in this document were discussed and addressed.  A limited physical exam was performed with this format.  Please refer to the patient's chart for his consent to telehealth for [facility]. ? ?   ? ? ?Date:  07/22/2021  ? ?ID:  Rodney Dominguez, DOB 1970/06/29, MRN [id number] ?The patient was identified using 2 identifiers. ? ?Patient Location: Home ?Provider Location: Home Office ? ? ?PCP:  Blair Heys, MD ?  ?[facility] Providers ?Cardiologist:  None ?Sleep Medicine:  Armanda Magic, MD    ? ?Evaluation Performed:  Follow-Up Visit ? ?Chief Complaint:  OSA ? ?History of Present Illness:   ? ?Rodney Dominguez is a 51 y.o. male with a hx of OSA on CPAP, HTN and obesity.  He in intolerant to amlodipine due to LE edema and bradycardia has limited increased doses of BB or CCB.  Renal duplex showed on evidence of RAS and 24 hour urine catecholamines were normal.  PRA/Aldo/Renin were normal.  He also was placed on HCTZ due to nephrolithiasis but developed significant hypokalemia that was very difficult to replete.  The diuretic was stopped and hypokalemia improved. His Urologist placed him back on HCTZ and spiro 25mg  daily for HTN was added as well as to help prevent hypokalemia. Toprol was subsequently stopped due to fatigue. Recent Urology appt his HCTZ was stopped and spiro decreased to 25mg  daily and started on Indapamide 1.25mg  daily.  He was dx with low testosterone and placed on testosterone and his fatigue has significantly improved. ? ?he is  here today for followup and is doing well.  He denies any chest pain or pressure, SOB, DOE, PND, orthopnea, LE edema, dizziness, palpitations or syncope. He is compliant with his meds and is tolerating meds with no SE.    ? ?He is doing well with his CPAP device and thinks that he has gotten used to it.  He tolerates the mask and feels the pressure is adequate.  Since going on CPAP he feels rested in the am and has no significant daytime sleepiness.  He denies any significant mouth or nasal dryness or nasal congestion.  He does not think that he snores.   His device is over 95 years old and he would like to get a new device.  ? ?The patient does not have symptoms concerning for COVID-19 infection (fever, chills, cough, or new shortness of breath).  ? ? ?Past Medical History:  ?Diagnosis Date  ? Diabetes mellitus without complication (HCC)   ? type II  ? Hypercholesteremia   ? Hypertension   ? Kidney stone   ? left -first ever; surgery planned  ? Obesity   ? Sleep apnea   ? cpap- settings 11  ? ?Past Surgical History:  ?Procedure Laterality Date  ? CYSTOSCOPY W/ RETROGRADES Left 10/28/2013  ? Procedure: CYSTOSCOPY WITH RETROGRADE PYELOGRAM LEFT URETEROSCOPY AND STENT PLACEMENT;  Surgeon: 5, MD;  Location: [facility];  Service: Urology;  Laterality: Left;  ? NEPHROLITHOTOMY Left 10/28/2013  ? Procedure: NEPHROLITHOTOMY PERCUTANEOUS WITH ACCESS;  Surgeon: Sebastian Ache  Berneice HeinrichManny, MD;  Location: [facility];  Service: Urology;  Laterality: Left;  ? WISDOM TOOTH EXTRACTION    ?  ? ?Current Meds  ?Medication Sig  ? aspirin EC 81 MG tablet Take 1 tablet (81 mg total) by mouth every morning.  ? atorvastatin (LIPITOR) 40 MG tablet Take 1 tablet (40 mg total) by mouth daily.  ? DILT-XR 240 MG 24 hr capsule TAKE 1 CAPSULE DAILY  ? doxazosin (CARDURA) 4 MG tablet Take 8 mg by mouth daily.   ? EPIPEN 2-PAK 0.3 MG/0.3ML SOAJ Inject 0.3 mg into the skin as needed (Allergic reaction).   ? famotidine (PEPCID) 20 MG tablet Take 20 mg by  mouth 2 (two) times daily.  ? indapamide (LOZOL) 1.25 MG tablet Take 1.25 mg by mouth daily.  ? metFORMIN (GLUCOPHAGE-XR) 500 MG 24 hr tablet Take 1,000 mg by mouth daily with breakfast.   ? olmesartan (BENICAR) 40 MG tablet Take 1 tablet (40 mg total) by mouth daily.  ? spironolactone (ALDACTONE) 25 MG tablet Take 25 mg by mouth daily.  ? Testosterone 1.62 % GEL   ? triamcinolone cream (KENALOG) 0.5 % Apply 1 application topically as needed. Arthritis in hand  ?  ? ?Allergies:   Bee venom, Amlodipine, and Ace inhibitors  ? ?Social History  ? ?Tobacco Use  ? Smoking status: Former  ?  Types: Pipe, Cigars  ?  Quit date: 10/21/2003  ?  Years since quitting: 17.7  ? Smokeless tobacco: Never  ? Tobacco comments:  ?  ocassionally smokes a cigar  ?Substance Use Topics  ? Alcohol use: No  ?  Comment: Heavy durin g college years, Quit '99  ? Drug use: No  ?  ? ?Family Hx: ?The patient's family history includes Cancer in his mother; Diabetes in his mother; Hyperlipidemia in his father and mother; Hypertension in his father and mother. ? ?ROS:   ?Please see the history of present illness.    ? ?All other systems reviewed and are negative. ? ? ?Prior CV studies:   ?The following studies were reviewed today: ? ?PAP compliance download ? ?Labs/Other Tests and Data Reviewed:   ? ?EKG:  No ECG reviewed. ? ?Recent Labs: ?No results found for requested labs within last 8760 hours.  ? ?Recent Lipid Panel ?No results found for: CHOL, TRIG, HDL, CHOLHDL, LDLCALC, LDLDIRECT ? ?Wt Readings from Last 3 Encounters:  ?07/22/21 282 lb (127.9 kg)  ?05/04/20 295 lb 6.4 oz (134 kg)  ?04/27/20 282 lb (127.9 kg)  ?  ? ?Risk Assessment/Calculations:   ?  ? ?    ?Objective:   ? ?Vital Signs:  BP 129/82   Pulse 72   Ht 6' (1.829 m)   Wt 282 lb (127.9 kg)   BMI 38.25 kg/m?   ? ?VITAL SIGNS:  reviewed ?GEN:  no acute distress ?EYES:  sclerae anicteric, EOMI - Extraocular Movements Intact ?RESPIRATORY:  normal respiratory effort, symmetric  expansion ?CARDIOVASCULAR:  no peripheral edema ?SKIN:  no rash, lesions or ulcers. ?MUSCULOSKELETAL:  no obvious deformities. ?NEURO:  alert and oriented x 3, no obvious focal deficit ?PSYCH:  normal affect ? ?ASSESSMENT & PLAN:   ? ?OSA - The patient is tolerating PAP therapy well without any problems. The PAP download performed by his DME was personally reviewed and interpreted by me today and showed an AHI of 0.5 /hr on 11 cm H2O with 80% compliance in using more than 4 hours nightly.  The patient has been using and benefiting  from PAP use and will continue to benefit from therapy.  ?-Order ResMed CPAP at 11cm H2O with heated humidity and mask of choice ? ?2.  Hypertension ?-BP has been controlled at home ?-He is intolerant to beta-blockers due to bradycardia and fatigue ?-Continue prescription drug management Cardizem CD 240 mg daily, olmesartan 40 mg daily, spironolactone 25 mg daily, indapamide 1.25 mg daily doxazosin 8 mg daily with as needed refills ?-he is seeing his PCP for labs tomorrow ? ? ?COVID-19 Education: ?The signs and symptoms of COVID-19 were discussed with the patient and how to seek care for testing (follow up with PCP or arrange E-visit).  The importance of social distancing was discussed today. ? ?Time:   ?Today, I have spent 15 minutes with the patient with telehealth technology discussing the above problems.   ? ? ?Medication Adjustments/Labs and Tests Ordered: ?Current medicines are reviewed at length with the patient today.  Concerns regarding medicines are outlined above.  ? ?Tests Ordered: ?No orders of the defined types were placed in this encounter. ? ? ?Medication Changes: ?No orders of the defined types were placed in this encounter. ? ? ?Follow Up:  In Person in 1 year(s) ? ?Signed, ?Armanda Magic, MD  ?07/22/2021 8:04 AM    ?[facility] ?

## 2021-07-22 ENCOUNTER — Telehealth: Payer: No Typology Code available for payment source | Admitting: Cardiology

## 2021-07-22 ENCOUNTER — Encounter: Payer: Self-pay | Admitting: Cardiology

## 2021-07-22 ENCOUNTER — Telehealth: Payer: Self-pay | Admitting: *Deleted

## 2021-07-22 VITALS — BP 129/82 | HR 72 | Ht 72.0 in | Wt 282.0 lb

## 2021-07-22 DIAGNOSIS — G4733 Obstructive sleep apnea (adult) (pediatric): Secondary | ICD-10-CM

## 2021-07-22 DIAGNOSIS — I10 Essential (primary) hypertension: Secondary | ICD-10-CM

## 2021-07-22 NOTE — Telephone Encounter (Signed)
Upon patient request DME selection is [facility]. Patient understands he will be contacted by [facility] to set up his cpap.Patient understands to call if [facility] does not contact him with new setup in a timely manner. Patient understands they will be called once confirmation has been received from [facility]/ that they have received their new machine to schedule 10 week follow up appointment.  [facility] notified of new cpap order  Please add to airview Patient was grateful for the call and thanked me. 

## 2021-07-22 NOTE — Telephone Encounter (Signed)
-----   Message from Theresia Majors, RN sent at 07/22/2021  8:11 AM EDT ----- ?Per Dr. Mayford Knife: ?Order ResMed CPAP at 11cm H2O with heated humidity and mask of choice. ?Thanks! ? ?

## 2021-07-22 NOTE — Patient Instructions (Signed)
Medication Instructions:  ?Your physician recommends that you continue on your current medications as directed. Please refer to the Current Medication list given to you today. ? ?*If you need a refill on your cardiac medications before your next appointment, please call your pharmacy* ? ?Follow-Up: ?At [facility], you and your health needs are our priority.  As part of our continuing mission to provide you with exceptional heart care, we have created designated Provider Care Teams.  These Care Teams include your primary Cardiologist (physician) and Advanced Practice Providers (APPs -  Physician Assistants and Nurse Practitioners) who all work together to provide you with the care you need, when you need it. ? ?Follow up with Dr. Mayford Knife after receiving your new device ? ?If primary card or EP is not listed click here to update    :1}  ? ? ?Important Information About Sugar ? ? ? ? ?  ?

## 2021-08-31 ENCOUNTER — Encounter: Payer: Self-pay | Admitting: Emergency Medicine

## 2021-08-31 ENCOUNTER — Ambulatory Visit
Admission: EM | Admit: 2021-08-31 | Discharge: 2021-08-31 | Disposition: A | Discharge status: Home / Self Care | Payer: No Typology Code available for payment source | Attending: Emergency Medicine | Admitting: Emergency Medicine

## 2021-08-31 DIAGNOSIS — L03213 Periorbital cellulitis: Secondary | ICD-10-CM | POA: Diagnosis not present

## 2021-08-31 MED ORDER — OLOPATADINE HCL 0.2 % OP SOLN: 1.0000 [drp] | Freq: Every day | OPHTHALMIC | 1 refills | Status: DC

## 2021-08-31 MED ORDER — AMOXICILLIN-POT CLAVULANATE 875-125 MG PO TABS: 1.0000 | ORAL_TABLET | Freq: Two times a day (BID) | ORAL | 0 refills | Status: AC

## 2021-08-31 NOTE — ED Provider Notes (Signed)
[facility]    CSN: [id number] Arrival date & time: 08/31/21  1123    HISTORY   Chief Complaint  Patient presents with   Eye Problem   HPI Rodney Dominguez is a 51 y.o. male. Patient presents urgent care stating that he woke up this morning with significant redness, pain and swelling of his left eye this morning.  Patient states he has also had excessive tears draining from his left eye.  Patient states the pain in his eyes from pressing on it and constant ache as well.  Patient denies vision changes, headache, purulent drainage from his eye, recent signs infection, ear pain, jaw pain, facial pain, fever.  Patient arrives wearing glasses with sunglasses over them, states he does not wear contacts.  The history is provided by the patient.  Past Medical History:  Diagnosis Date   Diabetes mellitus without complication (HCC)    type II   Hypercholesteremia    Hypertension    Kidney stone    left -first ever; surgery planned   Obesity    Sleep apnea    cpap- settings 11   Patient Active Problem List   Diagnosis Date Noted   Anemia due to unknown mechanism 12/09/2019   Chest pain 09/02/2016   Staghorn calculus 10/28/2013   Obstructive sleep apnea 06/20/2013   Essential hypertension, benign 06/20/2013   Obesity 06/20/2013   Past Surgical History:  Procedure Laterality Date   CYSTOSCOPY W/ RETROGRADES Left 10/28/2013   Procedure: CYSTOSCOPY WITH RETROGRADE PYELOGRAM LEFT URETEROSCOPY AND STENT PLACEMENT;  Surgeon: Sebastian Ache, MD;  Location: [facility];  Service: Urology;  Laterality: Left;   NEPHROLITHOTOMY Left 10/28/2013   Procedure: NEPHROLITHOTOMY PERCUTANEOUS WITH ACCESS;  Surgeon: Sebastian Ache, MD;  Location: [facility];  Service: Urology;  Laterality: Left;   WISDOM TOOTH EXTRACTION      Home Medications    Prior to Admission medications   Medication Sig Start Date End Date Taking? Authorizing Provider  amoxicillin-clavulanate (AUGMENTIN) 875-125 MG tablet  Take 1 tablet by mouth every 12 (twelve) hours for 7 days. 08/31/21 09/07/21 Yes Theadora Rama Scales, PA-C  Olopatadine HCl (PATADAY) 0.2 % SOLN Apply 1 drop to eye daily. 08/31/21  Yes Theadora Rama Scales, PA-C  aspirin EC 81 MG tablet Take 1 tablet (81 mg total) by mouth every morning. 09/08/15   Jerilee Field, MD  atorvastatin (LIPITOR) 40 MG tablet Take 1 tablet (40 mg total) by mouth daily. 06/29/19   Quintella Reichert, MD  DILT-XR 240 MG 24 hr capsule TAKE 1 CAPSULE DAILY 11/30/20   Quintella Reichert, MD  doxazosin (CARDURA) 4 MG tablet Take 8 mg by mouth daily.     [provider]  EPIPEN 2-PAK 0.3 MG/0.3ML SOAJ Inject 0.3 mg into the skin as needed (Allergic reaction).  08/02/12   [provider]  famotidine (PEPCID) 20 MG tablet Take 20 mg by mouth 2 (two) times daily.    [provider]  indapamide (LOZOL) 1.25 MG tablet Take 1.25 mg by mouth daily. 04/23/20   [provider]  metFORMIN (GLUCOPHAGE-XR) 500 MG 24 hr tablet Take 1,000 mg by mouth daily with breakfast.     [provider]  olmesartan (BENICAR) 40 MG tablet Take 1 tablet (40 mg total) by mouth daily. 06/27/20   Quintella Reichert, MD  spironolactone (ALDACTONE) 25 MG tablet Take 25 mg by mouth daily.    [provider]  Testosterone 1.62 % GEL  06/12/20   [provider]  triamcinolone cream (KENALOG) 0.5 % Apply 1 application topically as needed. Arthritis in hand 12/15/18   [provider]   Family History Family History  Problem Relation Age of Onset   Hypertension Mother    Hyperlipidemia Mother    Cancer Mother    Diabetes Mother    Hypertension Father    Hyperlipidemia Father    Social History Social History   Tobacco Use   Smoking status: Former    Types: Pipe, Cigars    Quit date: 10/21/2003    Years since quitting: 17.8   Smokeless tobacco: Never   Tobacco comments:    ocassionally smokes a cigar  Substance Use Topics   Alcohol use: No     Comment: Heavy durin g college years, Quit '99   Drug use: No   Allergies   Bee venom, Amlodipine, and Ace inhibitors  Review of Systems Review of Systems Pertinent findings noted in history of present illness.   Physical Exam Triage Vital Signs ED Triage Vitals  Enc Vitals Group     BP 02/08/21 0827 (!) 147/82     Pulse Rate 02/08/21 0827 72     Resp 02/08/21 0827 18     Temp 02/08/21 0827 98.3 F (36.8 C)     Temp Source 02/08/21 0827 Oral     SpO2 02/08/21 0827 98 %     Weight --      Height --      Head Circumference --      Peak Flow --      Pain Score 02/08/21 0826 5     Pain Loc --      Pain Edu? --      Excl. in GC? --   No data found.  Updated Vital Signs BP 112/72   Pulse 85   Temp (!) 97.4 F (36.3 C)   Resp 20   SpO2 98%   Physical Exam Vitals and nursing note reviewed.  Constitutional:      General: He is not in acute distress.    Appearance: Normal appearance. He is not ill-appearing.  HENT:     Head: Normocephalic and atraumatic.     Salivary Glands: Right salivary gland is not diffusely enlarged or tender. Left salivary gland is not diffusely enlarged or tender.     Right Ear: Tympanic membrane, ear canal and external ear normal. No drainage. No middle ear effusion. There is no impacted cerumen. Tympanic membrane is not erythematous or bulging.     Left Ear: Tympanic membrane, ear canal and external ear normal. No drainage.  No middle ear effusion. There is no impacted cerumen. Tympanic membrane is not erythematous or bulging.     Nose: Nose normal. No nasal deformity, septal deviation, mucosal edema, congestion or rhinorrhea.     Right Turbinates: Not enlarged, swollen or pale.     Left Turbinates: Not enlarged, swollen or pale.     Right Sinus: No maxillary sinus tenderness or frontal sinus tenderness.     Left Sinus: No maxillary sinus tenderness or frontal sinus tenderness.     Mouth/Throat:     Lips: Pink. No lesions.     Mouth: Mucous  membranes are moist. No oral lesions.     Pharynx: Oropharynx is clear. Uvula midline. No posterior oropharyngeal erythema or uvula swelling.     Tonsils: No tonsillar exudate. 0 on the right. 0 on the left.  Eyes:     General: Lids are normal. Lids are everted,  no foreign bodies appreciated. Vision grossly intact.        Right eye: No foreign body, discharge or hordeolum.        Left eye: No foreign body, discharge or hordeolum.     Extraocular Movements: Extraocular movements intact.     Conjunctiva/sclera:     Right eye: Right conjunctiva is not injected.     Left eye: Left conjunctiva is injected. Exudate present. No chemosis or hemorrhage.    Pupils: Pupils are equal, round, and reactive to light.     Right eye: Pupil is round, reactive and not sluggish.     Left eye: Pupil is round, reactive and not sluggish.     Comments: Mild exophthalmos on the left.  Palpation of left upper eyelid produces sharp pain at the lateral lower lid of the left eye.  There is no erythema of either eyelid.  Neck:     Trachea: Trachea and phonation normal.  Cardiovascular:     Rate and Rhythm: Normal rate and regular rhythm.     Pulses: Normal pulses.     Heart sounds: Normal heart sounds. No murmur heard.   No friction rub. No gallop.  Pulmonary:     Effort: Pulmonary effort is normal. No accessory muscle usage, prolonged expiration or respiratory distress.     Breath sounds: Normal breath sounds. No stridor, decreased air movement or transmitted upper airway sounds. No decreased breath sounds, wheezing, rhonchi or rales.  Chest:     Chest wall: No tenderness.  Musculoskeletal:        General: Normal range of motion.     Cervical back: Normal range of motion and neck supple. Normal range of motion.  Lymphadenopathy:     Cervical: No cervical adenopathy.  Skin:    General: Skin is warm and dry.     Findings: No erythema or rash.  Neurological:     General: No focal deficit present.     Mental  Status: He is alert and oriented to person, place, and time.  Psychiatric:        Mood and Affect: Mood normal.        Behavior: Behavior normal.    Visual Acuity Right Eye Distance:   Left Eye Distance:   Bilateral Distance:    Right Eye Near:   Left Eye Near:    Bilateral Near:     UC Couse / Diagnostics / Procedures:    EKG  Radiology No results found.  Procedures Procedures (including critical care time)  UC Diagnoses / Final Clinical Impressions(s)   I have reviewed the triage vital signs and the nursing notes.  Pertinent labs & imaging results that were available during my care of the patient were reviewed by me and considered in my medical decision making (see chart for details).   Final diagnoses:  Preseptal cellulitis of left eye   Patient provided with a prescription for Augmentin for presumed preseptal cellulitis.  Patient did not have any vision changes and denied acute pain.  Patient is also afebrile and did not show any signs of upper respiratory infection otherwise.  Patient given strict cautions to go to the emergency room in the next 24 to 36 hours if he has not seen meaningful improvement of the redness, swelling and pain in his left eye or to go sooner if he begins to have vision changes or increased pain.  Patient verbalized understanding agree with the plan.  Patient was provided with Pataday eyedrops to reduce his  redness and foreign body sensation in his eye.  ED Prescriptions     Medication Sig Dispense Auth. Provider   amoxicillin-clavulanate (AUGMENTIN) 875-125 MG tablet Take 1 tablet by mouth every 12 (twelve) hours for 7 days. 14 tablet Theadora RamaMorgan, Hall Birchard Scales, PA-C   Olopatadine HCl (PATADAY) 0.2 % SOLN Apply 1 drop to eye daily. 2.5 mL Theadora RamaMorgan, Darlisha Kelm Scales, PA-C      PDMP not reviewed this encounter.  Pending results:  Labs Reviewed - No data to display  Medications Ordered in UC: Medications - No data to display  Disposition Upon  Discharge:  Condition: stable for discharge home Home: take medications as prescribed; routine discharge instructions as discussed; follow up as advised.  Patient presented with an acute illness with associated systemic symptoms and significant discomfort requiring urgent management. In my opinion, this is a condition that a prudent lay person (someone who possesses an average knowledge of health and medicine) may potentially expect to result in complications if not addressed urgently such as respiratory distress, impairment of bodily function or dysfunction of bodily organs.   Routine symptom specific, illness specific and/or disease specific instructions were discussed with the patient and/or caregiver at length.   As such, the patient has been evaluated and assessed, work-up was performed and treatment was provided in alignment with urgent care protocols and evidence based medicine.  Patient/parent/caregiver has been advised that the patient may require follow up for further testing and treatment if the symptoms continue in spite of treatment, as clinically indicated and appropriate.  If the patient was tested for COVID-19, Influenza and/or RSV, then the patient/parent/guardian was advised to isolate at home pending the results of his/her diagnostic coronavirus test and potentially longer if they're positive. I have also advised pt that if his/her COVID-19 test returns positive, it's recommended to self-isolate for at least 10 days after symptoms first appeared AND until fever-free for 24 hours without fever reducer AND other symptoms have improved or resolved. Discussed self-isolation recommendations as well as instructions for household member/close contacts as per the [facility] and [place] DHHS, and also gave patient the COVID packet with this information.  Patient/parent/caregiver has been advised [facility] in 3-5 days if no better; to PCP or the Emergency Department if new signs and  symptoms develop, or if the current signs or symptoms continue to change or worsen for further workup, evaluation and treatment as clinically indicated and appropriate  The patient will follow up with their current PCP if and as advised. If the patient does not currently have a PCP we will assist them in obtaining one.   The patient may need specialty follow up if the symptoms continue, in spite of conservative treatment and management, for further workup, evaluation, consultation and treatment as clinically indicated and appropriate.  Patient/parent/caregiver verbalized understanding and agreement of plan as discussed.  All questions were addressed during visit.  Please see discharge instructions below for further details of plan.  Discharge Instructions:   Discharge Instructions      Please begin Augmentin 1 tablet twice daily for the next 7 days.  In the next 24 to 36 hours, if you have not had significant improvement of your symptoms, have worsening symptoms or began to have vision changes, please go to the emergency right away as this may indicate a more serious infection behind your left eye that requires more aggressive antibiotic therapy.  I have also provided you with a topical eyedrop called Pataday that is  an antihistamine.  This will help dry up the excessive tears and may help relieve some of the painful "foreign body sensation".  Thank you for visiting urgent care today.    This office note has been dictated using Teaching laboratory technician.  Unfortunately, and despite my best efforts, this method of dictation can sometimes lead to occasional typographical or grammatical errors.  I apologize in advance if this occurs.     Theadora Rama Scales, PA-C 08/31/21 1331

## 2021-08-31 NOTE — ED Triage Notes (Signed)
Pt here with left eye redness, pain and swelling since this morning. No changes to vision

## 2021-08-31 NOTE — Discharge Instructions (Addendum)
Please begin Augmentin 1 tablet twice daily for the next 7 days.  In the next 24 to 36 hours, if you have not had significant improvement of your symptoms, have worsening symptoms or began to have vision changes, please go to the emergency right away as this may indicate a more serious infection behind your left eye that requires more aggressive antibiotic therapy.  I have also provided you with a topical eyedrop called Pataday that is an antihistamine.  This will help dry up the excessive tears and may help relieve some of the painful "foreign body sensation".  Thank you for visiting urgent care today.

## 2021-09-13 ENCOUNTER — Telehealth (INDEPENDENT_AMBULATORY_CARE_PROVIDER_SITE_OTHER): Payer: No Typology Code available for payment source | Admitting: Cardiology

## 2021-09-13 ENCOUNTER — Encounter: Payer: Self-pay | Admitting: Cardiology

## 2021-09-13 VITALS — BP 128/79 | HR 83 | Wt 280.0 lb

## 2021-09-13 DIAGNOSIS — I10 Essential (primary) hypertension: Secondary | ICD-10-CM

## 2021-09-13 DIAGNOSIS — G4733 Obstructive sleep apnea (adult) (pediatric): Secondary | ICD-10-CM | POA: Diagnosis not present

## 2021-09-13 NOTE — Addendum Note (Signed)
Addended by: Macie Burows on: 09/13/2021 08:30 AM   Modules accepted: Orders

## 2021-09-13 NOTE — Progress Notes (Signed)
Virtual Visit via Video Note   This visit type was conducted due to national recommendations for restrictions regarding the COVID-19 Pandemic (e.g. social distancing) in an effort to limit this patient's exposure and mitigate transmission in our community.  Due to his co-morbid illnesses, this patient is at least at moderate risk for complications without adequate follow up.  This format is felt to be most appropriate for this patient at this time.  All issues noted in this document were discussed and addressed.  A limited physical exam was performed with this format.  Please refer to the patient's chart for his consent to telehealth for [facility].       Date:  09/13/2021   ID:  Rodney Dominguez, DOB December 06, 1970, MRN [id number] The patient was identified using 2 identifiers.  Patient Location: Home Provider Location: Home Office   PCP:  Blair Heys, MD   [facility] Providers Cardiologist:  None Sleep Medicine:  Armanda Magic, MD     Evaluation Performed:  Follow-Up Visit  Chief Complaint:  OSA  History of Present Illness:    Rodney Dominguez is a 51 y.o. male with a hx of OSA on CPAP, HTN and obesity.  At his last office visit he requested a new device for CPAP because his was more than 51 years old.  He is now here for follow-up per insurance requirements.  He is doing well with his CPAP device and thinks that he has gotten used to it.  He tolerates the mask and feels the pressure is adequate.  Since going on CPAP he feels rested in the am and has no significant daytime sleepiness.  He denies any significant mouth or nasal dryness or nasal congestion.  He does not think that he snores.     The patient does not have symptoms concerning for COVID-19 infection (fever, chills, cough, or new shortness of breath).    Past Medical History:  Diagnosis Date   Diabetes mellitus without complication (HCC)    type II   Hypercholesteremia    Hypertension    Kidney stone    left  -first ever; surgery planned   Obesity    Sleep apnea    cpap- settings 11   Past Surgical History:  Procedure Laterality Date   CYSTOSCOPY W/ RETROGRADES Left 10/28/2013   Procedure: CYSTOSCOPY WITH RETROGRADE PYELOGRAM LEFT URETEROSCOPY AND STENT PLACEMENT;  Surgeon: Sebastian Ache, MD;  Location: [facility];  Service: Urology;  Laterality: Left;   NEPHROLITHOTOMY Left 10/28/2013   Procedure: NEPHROLITHOTOMY PERCUTANEOUS WITH ACCESS;  Surgeon: Sebastian Ache, MD;  Location: [facility];  Service: Urology;  Laterality: Left;   WISDOM TOOTH EXTRACTION       Current Meds  Medication Sig   aspirin EC 81 MG tablet Take 1 tablet (81 mg total) by mouth every morning.   atorvastatin (LIPITOR) 40 MG tablet Take 1 tablet (40 mg total) by mouth daily.   DILT-XR 240 MG 24 hr capsule TAKE 1 CAPSULE DAILY   doxazosin (CARDURA) 8 MG tablet Take 8 mg by mouth daily.   EPIPEN 2-PAK 0.3 MG/0.3ML SOAJ Inject 0.3 mg into the skin as needed (Allergic reaction).    famotidine (PEPCID) 20 MG tablet Take 20 mg by mouth 2 (two) times daily.   indapamide (LOZOL) 1.25 MG tablet Take 1.25 mg by mouth daily.   metFORMIN (GLUCOPHAGE-XR) 500 MG 24 hr tablet Take 1,000 mg by mouth daily with breakfast.    olmesartan (BENICAR) 40 MG tablet Take 1 tablet (40  mg total) by mouth daily.   spironolactone (ALDACTONE) 25 MG tablet Take 25 mg by mouth daily.   Testosterone 40.5 MG/2.5GM (1.62%) GEL 1 packet to skin in the morning to shoulder and upper arms   triamcinolone cream (KENALOG) 0.5 % Apply 1 application topically as needed. Arthritis in hand   [DISCONTINUED] doxazosin (CARDURA) 4 MG tablet Take 8 mg by mouth daily.     Allergies:   Bee venom, Amlodipine, and Ace inhibitors   Social History   Tobacco Use   Smoking status: Former    Types: Pipe, Cigars    Quit date: 10/21/2003    Years since quitting: 17.9   Smokeless tobacco: Never   Tobacco comments:    ocassionally smokes a cigar  Substance Use Topics   Alcohol  use: No    Comment: Heavy durin g college years, Quit '99   Drug use: No     Family Hx: The patient's family history includes Cancer in his mother; Diabetes in his mother; Hyperlipidemia in his father and mother; Hypertension in his father and mother.  ROS:   Please see the history of present illness.     All other systems reviewed and are negative.   Prior CV studies:   The following studies were reviewed today:  PAP compliance download  Labs/Other Tests and Data Reviewed:    EKG:  No ECG reviewed.  Recent Labs: No results found for requested labs within last 8760 hours.   Recent Lipid Panel No results found for: CHOL, TRIG, HDL, CHOLHDL, LDLCALC, LDLDIRECT  Wt Readings from Last 3 Encounters:  09/13/21 280 lb (127 kg)  07/22/21 282 lb (127.9 kg)  05/04/20 295 lb 6.4 oz (134 kg)     Risk Assessment/Calculations:          Objective:    Vital Signs:  BP 128/79   Pulse 83   Wt 280 lb (127 kg)   BMI 37.97 kg/m   Well nourished, well developed male in no acute distress. Well appearing, alert and conversant, regular work of breathing,  good skin color  Eyes- anicteric mouth- oral mucosa is pink  neuro- grossly intact skin- no apparent rash or lesions or cyanosis   ASSESSMENT & PLAN:    OSA - The patient is tolerating PAP therapy well without any problems. The PAP download performed by his DME was personally reviewed and interpreted by me today and showed an AHI of 0.6 /hr on 11 cm H2O with 100 % compliance in using more than 4 hours nightly.  The patient has been using and benefiting from PAP use and will continue to benefit from therapy.    Hypertension -BP has been fairly well controlled at home -He is intolerant to beta-blockers due to bradycardia and fatigue -Continue prescription drug management with Cardizem CD 240 mg daily, olmesartan 40 mg daily, spironolactone 25 mg daily, indapamide 1.25 mg daily, doxazosin 10 mg daily with as needed  refills   COVID-19 Education: The signs and symptoms of COVID-19 were discussed with the patient and how to seek care for testing (follow up with PCP or arrange E-visit).  The importance of social distancing was discussed today.  Time:   Today, I have spent 10 minutes with the patient with telehealth technology discussing the above problems.     Medication Adjustments/Labs and Tests Ordered: Current medicines are reviewed at length with the patient today.  Concerns regarding medicines are outlined above.   Tests Ordered: No orders of the defined types were  placed in this encounter.   Medication Changes: No orders of the defined types were placed in this encounter.   Follow Up:  In Person in 1 year(s)  Signed, Armanda Magic, MD  09/13/2021 8:19 AM    [facility]

## 2021-09-13 NOTE — Patient Instructions (Signed)
Medication Instructions:  Your physician recommends that you continue on your current medications as directed. Please refer to the Current Medication list given to you today.  *If you need a refill on your cardiac medications before your next appointment, please call your pharmacy*   Lab Work: NONE If you have labs (blood work) drawn today and your tests are completely normal, you will receive your results only by: [place] (if you have MyChart) OR A paper copy in the mail If you have any lab test that is abnormal or we need to change your treatment, we will call you to review the results.   Testing/Procedures: NONE   Follow-Up: At [facility], you and your health needs are our priority.  As part of our continuing mission to provide you with exceptional heart care, we have created designated Provider Care Teams.  These Care Teams include your primary Cardiologist (physician) and Advanced Practice Providers (APPs -  Physician Assistants and Nurse Practitioners) who all work together to provide you with the care you need, when you need it.  Your next appointment:   1 year(s)  The format for your next appointment:   In Person  Provider:   Fransico Him, MD    Important Information About Sugar

## 2022-01-09 ENCOUNTER — Ambulatory Visit
Admission: RE | Admit: 2022-01-09 | Discharge: 2022-01-09 | Disposition: A | Discharge status: Home / Self Care | Payer: No Typology Code available for payment source | Source: Ambulatory Visit | Attending: Physician Assistant | Admitting: Physician Assistant

## 2022-01-09 ENCOUNTER — Other Ambulatory Visit: Payer: Self-pay | Admitting: Physician Assistant

## 2022-01-09 DIAGNOSIS — M79675 Pain in left toe(s): Secondary | ICD-10-CM

## 2023-01-06 NOTE — Progress Notes (Unsigned)
Date:  01/07/2023   ID:  Rodney Dominguez, DOB Jul 21, 1970, MRN [id number] The patient was identified using 2 identifiers.  PCP:  Blair Heys, MD   [facility] Providers Cardiologist:  None Sleep Medicine:  Armanda Magic, MD     Evaluation Performed:  Follow-Up Visit  Chief Complaint:  OSA  History of Present Illness:    Rodney Dominguez is a 52 y.o. male with a hx of OSA on CPAP, HTN and obesity.  He is doing well with his PAP device and thinks that he has gotten used to it.  He tolerates the mask and feels the pressure is adequate.  Since going on PAP he feels rested in the am and has no significant daytime sleepiness.  He denies any significant mouth or nasal dryness or nasal congestion.  He does not think that he snores.    Past Medical History:  Diagnosis Date   Diabetes mellitus without complication (HCC)    type II   Hypercholesteremia    Hypertension    Kidney stone    left -first ever; surgery planned   Obesity    Sleep apnea    cpap- settings 11   Past Surgical History:  Procedure Laterality Date   CYSTOSCOPY W/ RETROGRADES Left 10/28/2013   Procedure: CYSTOSCOPY WITH RETROGRADE PYELOGRAM LEFT URETEROSCOPY AND STENT PLACEMENT;  Surgeon: Sebastian Ache, MD;  Location: [facility];  Service: Urology;  Laterality: Left;   NEPHROLITHOTOMY Left 10/28/2013   Procedure: NEPHROLITHOTOMY PERCUTANEOUS WITH ACCESS;  Surgeon: Sebastian Ache, MD;  Location: [facility];  Service: Urology;  Laterality: Left;   WISDOM TOOTH EXTRACTION       Current Meds  Medication Sig   allopurinol (ZYLOPRIM) 100 MG tablet Take 100 mg by mouth daily.   aspirin EC 81 MG tablet Take 1 tablet (81 mg total) by mouth every morning.   atorvastatin (LIPITOR) 40 MG tablet Take 1 tablet (40 mg total) by mouth daily.   DILT-XR 240 MG 24 hr capsule TAKE 1 CAPSULE DAILY   doxazosin (CARDURA) 8 MG tablet Take 8 mg by mouth daily.   EPIPEN 2-PAK 0.3 MG/0.3ML SOAJ Inject 0.3 mg into the skin as needed (Allergic  reaction).    famotidine (PEPCID) 20 MG tablet Take 20 mg by mouth 2 (two) times daily.   indapamide (LOZOL) 1.25 MG tablet Take 1.25 mg by mouth daily.   metFORMIN (GLUCOPHAGE-XR) 500 MG 24 hr tablet Take 1,000 mg by mouth daily with breakfast.    olmesartan (BENICAR) 40 MG tablet Take 1 tablet (40 mg total) by mouth daily.   OZEMPIC, 0.25 OR 0.5 MG/DOSE, 2 MG/3ML SOPN 0.5 mg once a week.   spironolactone (ALDACTONE) 25 MG tablet Take 25 mg by mouth daily.   triamcinolone cream (KENALOG) 0.5 % Apply 1 application topically as needed. Arthritis in hand     Allergies:   Bee venom, Amlodipine, and Ace inhibitors   Social History   Tobacco Use   Smoking status: Former    Types: Pipe, Cigars    Quit date: 10/21/2003    Years since quitting: 19.2   Smokeless tobacco: Never   Tobacco comments:    ocassionally smokes a cigar  Substance Use Topics   Alcohol use: No    Comment: Heavy durin g college years, Quit '99   Drug use: No     Family Hx: The patient's family history includes Cancer in his mother; Diabetes in his mother; Hyperlipidemia in his father and mother; Hypertension in his father and  mother.  ROS:   Please see the history of present illness.     All other systems reviewed and are negative.   Prior CV studies:   The following studies were reviewed today:  PAP compliance download  Labs/Other Tests and Data Reviewed:    EKG:  No ECG reviewed.  Recent Labs: No results found for requested labs within last 365 days.   Recent Lipid Panel No results found for: "CHOL", "TRIG", "HDL", "CHOLHDL", "LDLCALC", "LDLDIRECT"  Wt Readings from Last 3 Encounters:  01/07/23 283 lb 9.6 oz (128.6 kg)  09/13/21 280 lb (127 kg)  07/22/21 282 lb (127.9 kg)     Risk Assessment/Calculations:          Objective:    Vital Signs:  BP 118/80   Pulse 80   Ht 6' (1.829 m)   Wt 283 lb 9.6 oz (128.6 kg)   SpO2 96%   BMI 38.46 kg/m   GEN: Well nourished, well developed in no  acute distress HEENT: Normal NECK: No JVD; No carotid bruits LYMPHATICS: No lymphadenopathy CARDIAC:RRR, no murmurs, rubs, gallops RESPIRATORY:  Clear to auscultation without rales, wheezing or rhonchi  ABDOMEN: Soft, non-tender, non-distended MUSCULOSKELETAL:  No edema; No deformity  SKIN: Warm and dry NEUROLOGIC:  Alert and oriented x 3 PSYCHIATRIC:  Normal affect  ASSESSMENT & PLAN:    OSA - The patient is tolerating PAP therapy well without any problems. The PAP download performed by his DME was personally reviewed and interpreted by me today and showed an AHI of 0.6/hr on 11 cm H2O with 100% compliance in using more than 4 hours nightly.  The patient has been using and benefiting from PAP use and will continue to benefit from therapy.    Hypertension -BP controlled -He is intolerant to beta-blockers due to bradycardia and fatigue -continue prescription drug management with Cardizem CD 240mg  daily, Olmesartan 40mg  daily,, spiro 25mg  daily, Indapamide 1.25mg  daily and Doxazosin 8mg  daily with PRN refills   Medication Adjustments/Labs and Tests Ordered: Current medicines are reviewed at length with the patient today.  Concerns regarding medicines are outlined above.   Tests Ordered: No orders of the defined types were placed in this encounter.   Medication Changes: No orders of the defined types were placed in this encounter.   Follow Up:  In Person in 1 year(s)  Signed, Armanda Magic, MD  01/07/2023 9:58 AM    [facility]

## 2023-01-07 ENCOUNTER — Ambulatory Visit: Payer: No Typology Code available for payment source | Attending: Cardiology | Admitting: Cardiology

## 2023-01-07 ENCOUNTER — Encounter: Payer: Self-pay | Admitting: Cardiology

## 2023-01-07 VITALS — BP 118/80 | HR 80 | Ht 72.0 in | Wt 283.6 lb

## 2023-01-07 DIAGNOSIS — I10 Essential (primary) hypertension: Secondary | ICD-10-CM | POA: Diagnosis not present

## 2023-01-07 DIAGNOSIS — G4733 Obstructive sleep apnea (adult) (pediatric): Secondary | ICD-10-CM

## 2023-01-07 NOTE — Patient Instructions (Signed)
Medication Instructions:  Your physician recommends that you continue on your current medications as directed. Please refer to the Current Medication list given to you today.  *If you need a refill on your cardiac medications before your next appointment, please call your pharmacy*   Lab Work: none If you have labs (blood work) drawn today and your tests are completely normal, you will receive your results only by: MyChart Message (if you have MyChart) OR A paper copy in the mail If you have any lab test that is abnormal or we need to change your treatment, we will call you to review the results.   Testing/Procedures: none   Follow-Up: At [facility], you and your health needs are our priority.  As part of our continuing mission to provide you with exceptional heart care, we have created designated Provider Care Teams.  These Care Teams include your primary Cardiologist (physician) and Advanced Practice Providers (APPs -  Physician Assistants and Nurse Practitioners) who all work together to provide you with the care you need, when you need it.  We recommend signing up for the patient portal called "MyChart".  Sign up information is provided on this After Visit Summary.  MyChart is used to connect with patients for Virtual Visits (Telemedicine).  Patients are able to view lab/test results, encounter notes, upcoming appointments, etc.  Non-urgent messages can be sent to your provider as well.   To learn more about what you can do with MyChart, go to [email or web address].    Your next appointment:   12 month(s)  Provider:   Dr Mayford Knife    Other Instructions

## 2023-02-10 ENCOUNTER — Other Ambulatory Visit: Payer: Self-pay

## 2023-02-10 ENCOUNTER — Emergency Department (HOSPITAL_COMMUNITY): Payer: No Typology Code available for payment source

## 2023-02-10 ENCOUNTER — Emergency Department (HOSPITAL_COMMUNITY)
Admission: EM | Admit: 2023-02-10 | Discharge: 2023-02-10 | Disposition: A | Discharge status: Home / Self Care | Payer: No Typology Code available for payment source | Attending: Emergency Medicine | Admitting: Emergency Medicine

## 2023-02-10 ENCOUNTER — Encounter (HOSPITAL_COMMUNITY): Payer: Self-pay | Admitting: Pharmacy Technician

## 2023-02-10 DIAGNOSIS — E119 Type 2 diabetes mellitus without complications: Secondary | ICD-10-CM | POA: Diagnosis not present

## 2023-02-10 DIAGNOSIS — I959 Hypotension, unspecified: Secondary | ICD-10-CM

## 2023-02-10 DIAGNOSIS — Z7982 Long term (current) use of aspirin: Secondary | ICD-10-CM | POA: Insufficient documentation

## 2023-02-10 DIAGNOSIS — Z79899 Other long term (current) drug therapy: Secondary | ICD-10-CM | POA: Insufficient documentation

## 2023-02-10 DIAGNOSIS — Z7984 Long term (current) use of oral hypoglycemic drugs: Secondary | ICD-10-CM | POA: Diagnosis not present

## 2023-02-10 DIAGNOSIS — I10 Essential (primary) hypertension: Secondary | ICD-10-CM | POA: Diagnosis not present

## 2023-02-10 DIAGNOSIS — R55 Syncope and collapse: Secondary | ICD-10-CM | POA: Insufficient documentation

## 2023-02-10 LAB — CBC WITH DIFFERENTIAL/PLATELET
Abs Immature Granulocytes: 0.01 10*3/uL (ref 0.00–0.07)
Basophils Absolute: 0.1 10*3/uL (ref 0.0–0.1)
Basophils Relative: 1 %
Eosinophils Absolute: 0.3 10*3/uL (ref 0.0–0.5)
Eosinophils Relative: 5 %
HCT: 40.4 % (ref 39.0–52.0)
Hemoglobin: 13.9 g/dL (ref 13.0–17.0)
Immature Granulocytes: 0 %
Lymphocytes Relative: 23 %
Lymphs Abs: 1.4 10*3/uL (ref 0.7–4.0)
MCH: 31.3 pg (ref 26.0–34.0)
MCHC: 34.4 g/dL (ref 30.0–36.0)
MCV: 91 fL (ref 80.0–100.0)
Monocytes Absolute: 0.7 10*3/uL (ref 0.1–1.0)
Monocytes Relative: 11 %
Neutro Abs: 3.6 10*3/uL (ref 1.7–7.7)
Neutrophils Relative %: 60 %
Platelets: 281 10*3/uL (ref 150–400)
RBC: 4.44 MIL/uL (ref 4.22–5.81)
RDW: 12.9 % (ref 11.5–15.5)
WBC: 6 10*3/uL (ref 4.0–10.5)
nRBC: 0 % (ref 0.0–0.2)

## 2023-02-10 LAB — TROPONIN I (HIGH SENSITIVITY)
Troponin I (High Sensitivity): 3 ng/L (ref <18)
Troponin I (High Sensitivity): 4 ng/L (ref <18)

## 2023-02-10 LAB — COMPREHENSIVE METABOLIC PANEL
ALT: 28 U/L (ref 0–44)
AST: 22 U/L (ref 15–41)
Albumin: 3.9 g/dL (ref 3.5–5.0)
Alkaline Phosphatase: 76 U/L (ref 38–126)
Anion gap: 13 (ref 5–15)
BUN: 22 mg/dL — ABNORMAL HIGH (ref 6–20)
CO2: 21 mmol/L — ABNORMAL LOW (ref 22–32)
Calcium: 9.5 mg/dL (ref 8.9–10.3)
Chloride: 103 mmol/L (ref 98–111)
Creatinine, Ser: 1.39 mg/dL — ABNORMAL HIGH (ref 0.61–1.24)
GFR, Estimated: >60 mL/min (ref >60)
Glucose, Bld: 127 mg/dL — ABNORMAL HIGH (ref 70–99)
Potassium: 3.4 mmol/L — ABNORMAL LOW (ref 3.5–5.1)
Sodium: 137 mmol/L (ref 135–145)
Total Bilirubin: 1.5 mg/dL — ABNORMAL HIGH (ref 0.3–1.2)
Total Protein: 7.2 g/dL (ref 6.5–8.1)

## 2023-02-10 LAB — I-STAT CG4 LACTIC ACID, ED
Lactic Acid, Venous: 2 mmol/L (ref 0.5–1.9)
Lactic Acid, Venous: 1.2 mmol/L (ref 0.5–1.9)

## 2023-02-10 LAB — MAGNESIUM: Magnesium: 2.1 mg/dL (ref 1.7–2.4)

## 2023-02-10 MED ORDER — SODIUM CHLORIDE 0.9 % IV BOLUS
1000.0000 mL | Freq: Once | INTRAVENOUS | Status: AC
  Administered 2023-02-10: 1000 mL via INTRAVENOUS

## 2023-02-10 NOTE — ED Triage Notes (Signed)
Pt here with reports of several episodes of near syncope today. Endorses checking BP at home and BP 81/67. On antihypertensives, has not had any changes to medications recently. Denies chest pain, shob.

## 2023-02-10 NOTE — ED Notes (Signed)
Pt given 2 cups water, saltine & graham crackers with PB

## 2023-02-10 NOTE — ED Provider Notes (Signed)
[facility] Provider Note   CSN: [id number] Arrival date & time: 02/10/23  1825     History  Chief Complaint  Patient presents with   Near Syncope    Rodney Dominguez is a 52 y.o. male with past medical history of hypertension, diabetes, struct of sleep apnea presented to emergency room with near syncope event today.  Patient reports he has had multiple episodes of feeling lightheaded or weak over the past few months with increasing frequency.  Patient reports that the lightheadedness occurs upon standing. Patient reports that he has had 15 pound weight loss since starting Ozempic reports decreased appetite and oral intake.  Patient reports that he has monitored blood pressure at home and had several episodes of low blood pressure at home as well.  Patient denies any chest pain, shortness of breath, headache, focal weakness, fever or chills.   Near Syncope       Home Medications Prior to Admission medications   Medication Sig Start Date End Date Taking? Authorizing Provider  allopurinol (ZYLOPRIM) 100 MG tablet Take 100 mg by mouth daily. 04/14/22  Yes [provider]  aspirin EC 81 MG tablet Take 1 tablet (81 mg total) by mouth every morning. 09/08/15  Yes Jerilee Field, MD  atorvastatin (LIPITOR) 40 MG tablet Take 1 tablet (40 mg total) by mouth daily. 06/29/19  Yes Turner, Traci R, MD  DILT-XR 240 MG 24 hr capsule TAKE 1 CAPSULE DAILY Patient taking differently: Take 240 mg by mouth every evening. 11/30/20  Yes Turner, Cornelious Bryant, MD  doxazosin (CARDURA) 8 MG tablet Take 8 mg by mouth every evening. 08/17/21  Yes [provider]  EPIPEN 2-PAK 0.3 MG/0.3ML SOAJ Inject 0.3 mg into the skin as needed (Allergic reaction).  08/02/12  Yes [provider]  famotidine (PEPCID) 20 MG tablet Take 20 mg by mouth 2 (two) times daily.   Yes [provider]  indapamide (LOZOL) 1.25 MG tablet Take 1.25 mg by mouth daily.  04/23/20  Yes [provider]  metFORMIN (GLUCOPHAGE-XR) 500 MG 24 hr tablet Take 1,000 mg by mouth daily with breakfast.    Yes [provider]  olmesartan (BENICAR) 40 MG tablet Take 1 tablet (40 mg total) by mouth daily. 06/27/20  Yes Turner, Traci R, MD  OZEMPIC, 0.25 OR 0.5 MG/DOSE, 2 MG/3ML SOPN 0.5 mg once a week. 10/13/22  Yes [provider]  spironolactone (ALDACTONE) 25 MG tablet Take 25 mg by mouth daily.   Yes [provider]  testosterone cypionate (DEPOTESTOSTERONE CYPIONATE) 200 MG/ML injection Inject 200 mg into the muscle every 30 (thirty) days. 01/28/23  Yes [provider]  triamcinolone cream (KENALOG) 0.5 % Apply 1 application topically as needed. Arthritis in hand 12/15/18  Yes [provider]      Allergies    Bee venom, Amlodipine, and Ace inhibitors    Review of Systems   Review of Systems  Cardiovascular:  Positive for near-syncope.    Physical Exam Updated Vital Signs BP 102/65   Pulse 67   Temp 98.1 F (36.7 C)   Resp 16   SpO2 98%  Physical Exam  ED Results / Procedures / Treatments   Labs (all labs ordered are listed, but only abnormal results are displayed) Labs Reviewed  COMPREHENSIVE METABOLIC PANEL - Abnormal; Notable for the following components:      Result Value   Potassium 3.4 (*)    CO2 21 (*)  Glucose, Bld 127 (*)    BUN 22 (*)    Creatinine, Ser 1.39 (*)    Total Bilirubin 1.5 (*)    All other components within normal limits  I-STAT CG4 LACTIC ACID, ED - Abnormal; Notable for the following components:   Lactic Acid, Venous 2.0 (*)    All other components within normal limits  CBC WITH DIFFERENTIAL/PLATELET  MAGNESIUM  URINALYSIS, ROUTINE W REFLEX MICROSCOPIC  I-STAT CG4 LACTIC ACID, ED  TROPONIN I (HIGH SENSITIVITY)  TROPONIN I (HIGH SENSITIVITY)    EKG EKG Interpretation Date/Time:  Tuesday February 10 2023 18:35:29 EDT Ventricular Rate:  101 PR Interval:  169 QRS  Duration:  99 QT Interval:  354 QTC Calculation: 459 R Axis:   -2  Text Interpretation: Sinus tachycardia Low voltage, precordial leads Abnormal R-wave progression, early transition Since last tracing rate faster no wpw, prolonged qt or brugada Otherwise no significant change Confirmed by Melene Plan [phone]) on 02/10/2023 9:04:24 PM  Radiology No results found.  Procedures Procedures    Medications Ordered in ED Medications  sodium chloride 0.9 % bolus 1,000 mL (has no administration in time range)  sodium chloride 0.9 % bolus 1,000 mL (0 mLs Intravenous Stopped 02/10/23 2116)    ED Course/ Medical Decision Making/ A&P                                 Medical Decision Making Amount and/or Complexity of Data Reviewed Labs: ordered. Radiology: ordered.   [name] 52 y.o. presented today for hypotension. Working DDx that I considered at this time includes, but not limited to, CVA/TIA, arrhythmia, vertigo, medication s/e, orthostatic hypotension, electrolyte abnormalities, dehydration, URI, ACS, UTI, anemia   R/o DDx: These are considered less likely than current impression due to history of present illness, physical exam, lab/imaging findings   Review of prior external notes: 01/07/2023  Pmhx: hypertension, diabetes, struct of sleep apnea  Unique Tests and My Interpretation:  CBC with differential: No leukocytosis, no anemia CMP: Potassium 3.4, BUN 22 creatinine 1.39 patient does not have any recent labs to compare to Magnesium 2.1 Lactic 2.0, repeat lactic was 1.2  Trop: 3, repeat 4  UA: Not having urinary symptoms, considered but not obtained    EKG: Rate, rhythm, axis, intervals all examined: sinus tachycardia     Imaging:  CXR no pulmonary cardiac process   Problem List / ED Course / Critical interventions / Medication management  Reporting to emergency room with low blood pressure, patient reports this has been intermittent.  Patient reports this  occurred approximately 2 hours after taking blood pressure medicine.  Patient has 2 negative troponins, blood pressure improved after fluids.  Is feeling well.  Not having any current symptoms.  Patient ambulated without assistance feels steady gait and did not feel dizzy.   Patient has no fever, no elevated white blood cell count and no source of infection.  Patient appears well and responded to fluids.  I think low blood pressure secondary to overmedication.  Given reassuring workup and labs.  Will have patient discontinue diltiazem and follow-up with primary care for further management of blood pressure.  Patient agrees to taking blood pressure 2 times a day will return to the emergency room with any new or worsening symptoms.  I ordered medication including NS  for hypotension   Reevaluation of the patient after these medicines showed that the patient improved Patients vitals assessed. Upon  arrival patient is  hemodynamically stable.,  Blood pressure improved after fluids. I have reviewed the patients home medicines and have made adjustments as needed  Consult: None  Plan: Discontinue diltiazem.  Call primary care for further evaluation monitor blood pressure F/u w/ PCP in 2-3d to ensure resolution of sx. call cardiology to schedule appointment for follow-up Patient was given return precautions. Patient stable for discharge at this time.  Patient educated on current sx/dx and verbalized understanding of plan. Return to ER w/ new or worsening sx.          Final Clinical Impression(s) / ED Diagnoses Final diagnoses:  None    Rx / DC Orders ED Discharge Orders     None         Smitty Knudsen, PA-C 02/10/23 2341    Melene Plan, DO 02/10/23 2342

## 2023-02-10 NOTE — ED Notes (Signed)
Patient transported to X-ray 

## 2023-02-10 NOTE — ED Provider Triage Note (Signed)
Emergency Medicine Provider Triage Evaluation Note  Keil Corazza , a 52 y.o. male  was evaluated in triage.  Pt complains of feeling lightheaded and like he was about to pass out today. BP today has been very low. Feels like he is going to pass out when standing for the last few days. No chest pain, SOB, abdominal pain, palpitations. No N/V/D. Has not taken meds differently. Reports has lost 20 lbs past 2 months.  Review of Systems  Positive: presyncope Negative: Chest pain  Physical Exam  BP 95/76 (BP Location: Right Arm)   Pulse (!) 101   Temp 98.1 F (36.7 C)   Resp 15   SpO2 95%  Gen:   Awake, no distress   Resp:  Normal effort  MSK:   Moves extremities without difficulty   Medical Decision Making  Medically screening exam initiated at 6:38 PM.  Appropriate orders placed.  Monserrate Burgin was informed that the remainder of the evaluation will be completed by another provider, this initial triage assessment does not replace that evaluation, and the importance of remaining in the ED until their evaluation is complete.   Pete Pelt, [place] 02/10/23 [phone]

## 2023-02-10 NOTE — Discharge Instructions (Addendum)
You were seen in the emergency room for low blood pressure.  Labs and imaging came back reassuring.  I feel that your blood pressure is secondary to too much blood pressure medications.   I would recommend decreasing blood pressure medication.  I did recommend discontinuing the diltiazem.  Please monitor your blood pressure 3 times a day.  If you continue to have low blood pressure or abnormal readings even after discontinuing diltiazem, you can consider reaching out to primary care for further blood pressure adjustments or holding the losartan as well.  Ultimately I would like you to call primary care for follow-up from today's visit and further adjustment of blood pressure medication.  You can always return to the emergency room if your low blood pressure remains low or you become very symptomatic with a low blood pressure.  Please call cardiology to schedule an appointment for follow-up.  Return to emergency room with any new or worsening symptoms.

## 2023-02-11 ENCOUNTER — Encounter (HOSPITAL_COMMUNITY): Payer: Self-pay | Admitting: Medical
